# Patient Record
Sex: Female | Born: 1960
Health system: Southern US, Community
[De-identification: ages and names within clinical notes are randomized; demographics above are authoritative.]

## PROBLEM LIST (undated history)

## (undated) DIAGNOSIS — D649 Anemia, unspecified: Secondary | ICD-10-CM

## (undated) DIAGNOSIS — T7840XA Allergy, unspecified, initial encounter: Secondary | ICD-10-CM

## (undated) DIAGNOSIS — M5136 Other intervertebral disc degeneration, lumbar region: Secondary | ICD-10-CM

## (undated) DIAGNOSIS — R21 Rash and other nonspecific skin eruption: Secondary | ICD-10-CM

## (undated) DIAGNOSIS — F419 Anxiety disorder, unspecified: Secondary | ICD-10-CM

## (undated) DIAGNOSIS — M810 Age-related osteoporosis without current pathological fracture: Secondary | ICD-10-CM

## (undated) DIAGNOSIS — S82209A Unspecified fracture of shaft of unspecified tibia, initial encounter for closed fracture: Secondary | ICD-10-CM

## (undated) DIAGNOSIS — K219 Gastro-esophageal reflux disease without esophagitis: Secondary | ICD-10-CM

## (undated) DIAGNOSIS — M51369 Other intervertebral disc degeneration, lumbar region without mention of lumbar back pain or lower extremity pain: Secondary | ICD-10-CM

## (undated) DIAGNOSIS — S82409A Unspecified fracture of shaft of unspecified fibula, initial encounter for closed fracture: Secondary | ICD-10-CM

## (undated) HISTORY — DX: Allergy, unspecified, initial encounter: T78.40XA

## (undated) HISTORY — PX: DILATION AND CURETTAGE OF UTERUS: SHX78

## (undated) HISTORY — DX: Anemia, unspecified: D64.9

## (undated) HISTORY — DX: Age-related osteoporosis without current pathological fracture: M81.0

## (undated) HISTORY — PX: FRACTURE SURGERY: SHX138

---

## 1994-04-11 HISTORY — PX: DILATION AND CURETTAGE OF UTERUS: SHX78

## 1997-09-19 ENCOUNTER — Other Ambulatory Visit: Admission: RE | Admit: 1997-09-19 | Discharge: 1997-09-19 | Payer: Self-pay | Admitting: *Deleted

## 1999-08-26 ENCOUNTER — Encounter: Payer: Self-pay | Admitting: Specialist

## 1999-08-26 ENCOUNTER — Encounter: Admission: RE | Admit: 1999-08-26 | Discharge: 1999-08-26 | Payer: Self-pay | Admitting: Specialist

## 1999-09-10 ENCOUNTER — Other Ambulatory Visit: Admission: RE | Admit: 1999-09-10 | Discharge: 1999-09-10 | Payer: Self-pay | Admitting: *Deleted

## 2004-05-10 ENCOUNTER — Encounter: Admission: RE | Admit: 2004-05-10 | Discharge: 2004-05-10 | Payer: Self-pay | Admitting: Specialist

## 2004-05-25 ENCOUNTER — Encounter: Admission: RE | Admit: 2004-05-25 | Discharge: 2004-05-25 | Payer: Self-pay | Admitting: Obstetrics and Gynecology

## 2004-10-01 ENCOUNTER — Ambulatory Visit: Payer: Self-pay | Admitting: Internal Medicine

## 2004-10-14 ENCOUNTER — Ambulatory Visit: Payer: Self-pay | Admitting: Internal Medicine

## 2005-07-26 ENCOUNTER — Ambulatory Visit: Payer: Self-pay | Admitting: Internal Medicine

## 2006-11-20 ENCOUNTER — Encounter: Admission: RE | Admit: 2006-11-20 | Discharge: 2006-11-20 | Payer: Self-pay | Admitting: Emergency Medicine

## 2007-07-13 ENCOUNTER — Emergency Department (HOSPITAL_COMMUNITY): Admission: EM | Admit: 2007-07-13 | Discharge: 2007-07-13 | Payer: Self-pay | Admitting: Emergency Medicine

## 2007-11-21 ENCOUNTER — Encounter: Admission: RE | Admit: 2007-11-21 | Discharge: 2007-11-21 | Payer: Self-pay | Admitting: Emergency Medicine

## 2009-01-23 ENCOUNTER — Encounter: Admission: RE | Admit: 2009-01-23 | Discharge: 2009-01-23 | Payer: Self-pay | Admitting: Family Medicine

## 2009-01-23 ENCOUNTER — Encounter: Payer: Self-pay | Admitting: Internal Medicine

## 2011-02-19 ENCOUNTER — Emergency Department (HOSPITAL_COMMUNITY)
Admission: EM | Admit: 2011-02-19 | Discharge: 2011-02-19 | Disposition: A | Discharge status: Home / Self Care | Payer: Self-pay | Attending: Emergency Medicine | Admitting: Emergency Medicine

## 2011-02-19 ENCOUNTER — Emergency Department (HOSPITAL_COMMUNITY): Payer: Self-pay

## 2011-02-19 ENCOUNTER — Encounter: Payer: Self-pay | Admitting: *Deleted

## 2011-02-19 DIAGNOSIS — S82899A Other fracture of unspecified lower leg, initial encounter for closed fracture: Secondary | ICD-10-CM | POA: Insufficient documentation

## 2011-02-19 DIAGNOSIS — M25579 Pain in unspecified ankle and joints of unspecified foot: Secondary | ICD-10-CM | POA: Insufficient documentation

## 2011-02-19 DIAGNOSIS — R209 Unspecified disturbances of skin sensation: Secondary | ICD-10-CM | POA: Insufficient documentation

## 2011-02-19 DIAGNOSIS — W11XXXA Fall on and from ladder, initial encounter: Secondary | ICD-10-CM | POA: Insufficient documentation

## 2011-02-19 DIAGNOSIS — M549 Dorsalgia, unspecified: Secondary | ICD-10-CM | POA: Insufficient documentation

## 2011-02-19 LAB — BASIC METABOLIC PANEL
BUN: 12 mg/dL (ref 6–23)
Creatinine, Ser: 0.68 mg/dL (ref 0.50–1.10)
GFR calc Af Amer: >90 mL/min (ref >90)
GFR calc non Af Amer: >90 mL/min (ref >90)
Glucose, Bld: 120 mg/dL — ABNORMAL HIGH (ref 70–99)

## 2011-02-19 LAB — CBC
HCT: 37.7 % (ref 36.0–46.0)
Hemoglobin: 13 g/dL (ref 12.0–15.0)
MCH: 30.2 pg (ref 26.0–34.0)
MCHC: 34.5 g/dL (ref 30.0–36.0)
MCV: 87.5 fL (ref 78.0–100.0)

## 2011-02-19 LAB — DIFFERENTIAL
Basophils Relative: 0 % (ref 0–1)
Eosinophils Absolute: 0.1 10*3/uL (ref 0.0–0.7)
Monocytes Absolute: 0.6 10*3/uL (ref 0.1–1.0)
Monocytes Relative: 6 % (ref 3–12)

## 2011-02-19 MED ORDER — OXYCODONE-ACETAMINOPHEN 5-325 MG PO TABS: 1.0000 | ORAL_TABLET | ORAL | Status: AC | PRN

## 2011-02-19 MED ORDER — MORPHINE SULFATE 4 MG/ML IJ SOLN
4.0000 mg | Freq: Once | INTRAMUSCULAR | Status: AC
  Administered 2011-02-19: 4 mg via INTRAVENOUS
  Filled 2011-02-19: qty 1

## 2011-02-19 MED ORDER — HYDROMORPHONE HCL PF 1 MG/ML IJ SOLN
1.0000 mg | Freq: Once | INTRAMUSCULAR | Status: AC
  Administered 2011-02-19: 1 mg via INTRAVENOUS
  Filled 2011-02-19: qty 1

## 2011-02-19 MED ORDER — DIPHENHYDRAMINE HCL 50 MG/ML IJ SOLN
25.0000 mg | Freq: Once | INTRAMUSCULAR | Status: AC
  Administered 2011-02-19: 25 mg via INTRAVENOUS
  Filled 2011-02-19: qty 1

## 2011-02-19 NOTE — ED Provider Notes (Signed)
History     CSN: [id number] Arrival date & time: 02/19/2011 12:50 PM   First MD Initiated Contact with Patient 02/19/11 1314      Chief Complaint  Patient presents with  . Ankle Pain    (Consider location/radiation/quality/duration/timing/severity/associated sxs/prior treatment) Patient is a 50 y.o. female presenting with ankle pain. The history is provided by the patient (Patient states that she's off a ladder yesterday and hurt her right ankle just complains of mild back pain she has not walked since that).  Ankle Pain  The incident occurred yesterday. The incident occurred at home. The injury mechanism was a fall. The pain is present in the right ankle. The quality of the pain is described as sharp. The pain is at a severity of 5/10. The pain is moderate. The pain has been intermittent since onset. Associated symptoms include numbness and inability to bear weight. She has tried nothing for the symptoms.    Past Medical History  Diagnosis Date  . Degeneration, intervertebral disc     History reviewed. No pertinent past surgical history.  No family history on file.  History  Substance Use Topics  . Smoking status: Current Everyday Smoker  . Smokeless tobacco: Not on file  . Alcohol Use: Yes     occ    OB History    Grav Para Term Preterm Abortions TAB SAB Ect Mult Living                  Review of Systems  Constitutional: Negative for fatigue.  HENT: Negative for congestion, sinus pressure and ear discharge.   Eyes: Negative for discharge.  Respiratory: Negative for cough.   Cardiovascular: Negative for chest pain.  Gastrointestinal: Negative for abdominal pain and diarrhea.  Genitourinary: Negative for frequency and hematuria.  Musculoskeletal: Negative for back pain.       Severe ankle pain  Skin: Negative for rash.  Neurological: Positive for numbness. Negative for seizures and headaches.  Hematological: Negative.   Psychiatric/Behavioral: Negative for  hallucinations.    Allergies  Erythromycin  Home Medications   Current Outpatient Rx  Name Route Sig Dispense Refill  . ALBUTEROL SULFATE HFA 108 (90 BASE) MCG/ACT IN AERS Inhalation Inhale 2 puffs into the lungs every 6 (six) hours as needed. For shortness of breath     . AMOXICILLIN 500 MG PO CAPS Oral Take 500 mg by mouth 2 (two) times daily.      Marland Kitchen HYDROCODONE-ACETAMINOPHEN 5-500 MG PO TABS Oral Take 1 tablet by mouth every 6 (six) hours as needed. For pain      . OXYCODONE-ACETAMINOPHEN 5-325 MG PO TABS Oral Take 1 tablet by mouth every 4 (four) hours as needed for pain. 30 tablet 0    BP 99/67  Pulse 65  Temp(Src) 97.9 F (36.6 C) (Oral)  Resp 20  SpO2 94%  Physical Exam  Constitutional: She is oriented to person, place, and time. She appears well-developed.  HENT:  Head: Normocephalic and atraumatic.  Eyes: Conjunctivae and EOM are normal. No scleral icterus.  Neck: Neck supple. No thyromegaly present.  Cardiovascular: Normal rate and regular rhythm.  Exam reveals no gallop and no friction rub.   No murmur heard. Pulmonary/Chest: No stridor. She has no wheezes. She has no rales. She exhibits no tenderness.  Abdominal: She exhibits no distension. There is no tenderness. There is no rebound.  Musculoskeletal:       Patient has tenderness and swelling to right ankle dorsalis pedis pulses 2+ mild deformity  to ankle. Patient also has mild tenderness to lumbar spine full range of motion of that  Lymphadenopathy:    She has no cervical adenopathy.  Neurological: She is oriented to person, place, and time. Coordination normal.  Skin: No rash noted. No erythema.  Psychiatric: She has a normal mood and affect. Her behavior is normal.    ED Course  Procedures (including critical care time)  Labs Reviewed  CBC - Abnormal; Notable for the following:    WBC 10.9 (*)    All other components within normal limits  DIFFERENTIAL - Abnormal; Notable for the following:     Neutrophils Relative 82 (*)    Neutro Abs 8.9 (*)    All other components within normal limits  BASIC METABOLIC PANEL - Abnormal; Notable for the following:    Sodium 134 (*)    Glucose, Bld 120 (*)    All other components within normal limits   Dg Lumbar Spine Complete  02/19/2011  *RADIOLOGY REPORT*  Clinical Data: Fall  LUMBAR SPINE - COMPLETE 4+ VIEW  Comparison: MRI 05/10/2004  Findings: Normal alignment lumbar vertebral bodies. There is an endplate compression fracture of the anterior superior endplate of the L2 vertebral body.  There is approximately 10% loss vertebral body height at this level. No retropulsion.  There is degenerative changes at L3-L4 and L4-L5 with disc space narrowing and endplate osteophytosis.  No evidence retropulsion.  No subluxation.  IMPRESSION: Mild compression fracture of the superior endplate of the L2 vertebral body.  Original Report Authenticated By: Genevive Bi, M.D.   Dg Tibia/fibula Right  02/19/2011  *RADIOLOGY REPORT*  Clinical Data: Fall from ladder  RIGHT TIBIA AND FIBULA - 2 VIEW  Comparison: None.  Findings: There is an oblique comminuted fracture through the distal fibular diaphysis.  No evidence of proximal fracture within the tibia or fibula.  IMPRESSION: Comminuted fracture of the distal fibula diaphysis.  Original Report Authenticated By: Genevive Bi, M.D.   Dg Ankle Complete Right  02/19/2011  *RADIOLOGY REPORT*  Clinical Data: Fall from ladder  RIGHT ANKLE - COMPLETE 3+ VIEW  Comparison: None.  Findings: There is a comminuted fracture of the distal tibia which enters the articular surface.  There is a fracture of the distal fibular diaphysis which is also comminuted.  Posterior malleolus is fractured.  The ankle mortise is grossly intact. Question injury to the lateral aspect the talus.  IMPRESSION:  1.  Comminuted intra-articular fracture of the distal tibia. 2.   Fracture of the fibular diaphysis. 3.  Question injury to the lateral  aspect of the talar dome  Original Report Authenticated By: Genevive Bi, M.D.     1. Ankle fracture    I spoke with Dr. supple and he stated that the patient can be put in a stirrup splint and can followup on Monday at their office   MDM  Ankle fx        Benny Lennert, MD 02/19/11 [phone]

## 2011-02-19 NOTE — ED Notes (Signed)
Pt is pain free.  Paper scrubs given

## 2011-02-19 NOTE — ED Notes (Signed)
Severe pain with splint application.  Verbal order from Dr. Estell Harpin for dilaudid 1mg  IVP.  Given, pt feels better after

## 2011-02-19 NOTE — Progress Notes (Signed)
Orthopedic Tech Progress Note Patient Details:  Jasmin Bonilla April 04, 1961 [id number]  Other Ortho Devices Type of Ortho Device: Crutches Ortho Device Interventions: Application  Type of Splint: Stirrup Splint Location: right leg Splint Interventions: Application    Nikki Dom 02/19/2011, 4:25 PM

## 2011-02-19 NOTE — ED Notes (Signed)
Pt was climbing a ladder about 7-65ft and fell off yesterday.  Pt has swelling and pain to right ankle and lower back pain.  No blood thinners

## 2011-02-19 NOTE — ED Notes (Signed)
Pt is in no distress, swelling of ankle, ice pack in place.  Ortho called for splint and crutches

## 2011-02-19 NOTE — ED Notes (Signed)
Pt up into bed, ice applied to right ankle, family at bedside

## 2011-02-28 ENCOUNTER — Ambulatory Visit
Admission: RE | Admit: 2011-02-28 | Discharge: 2011-02-28 | Disposition: A | Discharge status: Home / Self Care | Payer: BC Managed Care – PPO | Source: Ambulatory Visit | Attending: Orthopedic Surgery | Admitting: Orthopedic Surgery

## 2011-02-28 ENCOUNTER — Other Ambulatory Visit: Payer: Self-pay | Admitting: Orthopedic Surgery

## 2011-02-28 DIAGNOSIS — S82209A Unspecified fracture of shaft of unspecified tibia, initial encounter for closed fracture: Secondary | ICD-10-CM

## 2011-03-11 ENCOUNTER — Encounter (HOSPITAL_BASED_OUTPATIENT_CLINIC_OR_DEPARTMENT_OTHER): Payer: Self-pay | Admitting: *Deleted

## 2011-03-11 NOTE — Pre-Procedure Instructions (Signed)
To come for EKG  Current non-prod. cough  Cat scratch right arm  States periods have become very irregular, no period in 2 mos.

## 2011-03-14 ENCOUNTER — Encounter (HOSPITAL_BASED_OUTPATIENT_CLINIC_OR_DEPARTMENT_OTHER)
Admission: RE | Admit: 2011-03-14 | Discharge: 2011-03-14 | Disposition: A | Discharge status: Home / Self Care | Payer: BC Managed Care – PPO | Source: Ambulatory Visit | Attending: Orthopedic Surgery | Admitting: Orthopedic Surgery

## 2011-03-15 ENCOUNTER — Encounter (HOSPITAL_BASED_OUTPATIENT_CLINIC_OR_DEPARTMENT_OTHER): Payer: Self-pay | Admitting: *Deleted

## 2011-03-15 ENCOUNTER — Ambulatory Visit (HOSPITAL_COMMUNITY): Payer: BC Managed Care – PPO

## 2011-03-15 ENCOUNTER — Ambulatory Visit (HOSPITAL_BASED_OUTPATIENT_CLINIC_OR_DEPARTMENT_OTHER): Payer: BC Managed Care – PPO | Admitting: *Deleted

## 2011-03-15 ENCOUNTER — Ambulatory Visit (HOSPITAL_BASED_OUTPATIENT_CLINIC_OR_DEPARTMENT_OTHER)
Admission: RE | Admit: 2011-03-15 | Discharge: 2011-03-16 | Disposition: A | Discharge status: Home / Self Care | Payer: BC Managed Care – PPO | Source: Ambulatory Visit | Attending: Orthopedic Surgery | Admitting: Orthopedic Surgery

## 2011-03-15 ENCOUNTER — Encounter (HOSPITAL_BASED_OUTPATIENT_CLINIC_OR_DEPARTMENT_OTHER)
Admission: RE | Disposition: A | Discharge status: Home / Self Care | Payer: Self-pay | Source: Ambulatory Visit | Attending: Orthopedic Surgery

## 2011-03-15 DIAGNOSIS — X58XXXA Exposure to other specified factors, initial encounter: Secondary | ICD-10-CM | POA: Insufficient documentation

## 2011-03-15 DIAGNOSIS — S82209A Unspecified fracture of shaft of unspecified tibia, initial encounter for closed fracture: Secondary | ICD-10-CM | POA: Insufficient documentation

## 2011-03-15 DIAGNOSIS — Z01812 Encounter for preprocedural laboratory examination: Secondary | ICD-10-CM | POA: Insufficient documentation

## 2011-03-15 DIAGNOSIS — S82409A Unspecified fracture of shaft of unspecified fibula, initial encounter for closed fracture: Secondary | ICD-10-CM | POA: Insufficient documentation

## 2011-03-15 DIAGNOSIS — S82309A Unspecified fracture of lower end of unspecified tibia, initial encounter for closed fracture: Secondary | ICD-10-CM

## 2011-03-15 DIAGNOSIS — K219 Gastro-esophageal reflux disease without esophagitis: Secondary | ICD-10-CM | POA: Insufficient documentation

## 2011-03-15 HISTORY — DX: Gastro-esophageal reflux disease without esophagitis: K21.9

## 2011-03-15 HISTORY — DX: Anxiety disorder, unspecified: F41.9

## 2011-03-15 HISTORY — DX: Other intervertebral disc degeneration, lumbar region without mention of lumbar back pain or lower extremity pain: M51.369

## 2011-03-15 HISTORY — DX: Other intervertebral disc degeneration, lumbar region: M51.36

## 2011-03-15 HISTORY — DX: Unspecified fracture of shaft of unspecified tibia, initial encounter for closed fracture: S82.409A

## 2011-03-15 HISTORY — DX: Unspecified fracture of shaft of unspecified tibia, initial encounter for closed fracture: S82.209A

## 2011-03-15 HISTORY — PX: ORIF FIBULA FRACTURE: SHX5114

## 2011-03-15 HISTORY — DX: Rash and other nonspecific skin eruption: R21

## 2011-03-15 SURGERY — OPEN REDUCTION INTERNAL FIXATION (ORIF) FIBULA FRACTURE
Anesthesia: General | Laterality: Right

## 2011-03-15 MED ORDER — ONDANSETRON HCL 4 MG PO TABS: 4.0000 mg | ORAL_TABLET | Freq: Four times a day (QID) | ORAL | Status: DC | PRN

## 2011-03-15 MED ORDER — EPHEDRINE SULFATE 50 MG/ML IJ SOLN
INTRAMUSCULAR | Status: DC | PRN
  Administered 2011-03-15: 5 mg via INTRAVENOUS

## 2011-03-15 MED ORDER — FAMOTIDINE 20 MG PO TABS: 20.0000 mg | ORAL_TABLET | Freq: Two times a day (BID) | ORAL | Status: DC

## 2011-03-15 MED ORDER — FENTANYL CITRATE 0.05 MG/ML IJ SOLN
INTRAMUSCULAR | Status: DC | PRN
  Administered 2011-03-15 (×4): 25 ug via INTRAVENOUS

## 2011-03-15 MED ORDER — DIPHENHYDRAMINE HCL 25 MG PO TABS
25.0000 mg | ORAL_TABLET | ORAL | Status: DC | PRN
  Administered 2011-03-16: 25 mg via ORAL

## 2011-03-15 MED ORDER — CEFAZOLIN SODIUM-DEXTROSE 2-3 GM-% IV SOLR
2.0000 g | INTRAVENOUS | Status: AC
  Administered 2011-03-15: 1 g via INTRAVENOUS

## 2011-03-15 MED ORDER — OXYCODONE HCL 10 MG PO TB12: 10.0000 mg | ORAL_TABLET | Freq: Two times a day (BID) | ORAL | Status: DC

## 2011-03-15 MED ORDER — DEXTROSE-NACL 5-0.45 % IV SOLN: INTRAVENOUS | Status: DC

## 2011-03-15 MED ORDER — ONDANSETRON HCL 4 MG/2ML IJ SOLN
INTRAMUSCULAR | Status: DC | PRN
  Administered 2011-03-15: 4 mg via INTRAVENOUS

## 2011-03-15 MED ORDER — ZOLPIDEM TARTRATE 5 MG PO TABS: 5.0000 mg | ORAL_TABLET | Freq: Every evening | ORAL | Status: DC | PRN

## 2011-03-15 MED ORDER — METHOCARBAMOL 500 MG PO TABS
500.0000 mg | ORAL_TABLET | Freq: Four times a day (QID) | ORAL | Status: DC | PRN
  Administered 2011-03-15 – 2011-03-16 (×4): 500 mg via ORAL

## 2011-03-15 MED ORDER — BUPIVACAINE-EPINEPHRINE PF 0.5-1:200000 % IJ SOLN
INTRAMUSCULAR | Status: DC | PRN
  Administered 2011-03-15: 25 mL

## 2011-03-15 MED ORDER — OXYCODONE HCL 5 MG PO TABS: 5.0000 mg | ORAL_TABLET | ORAL | Status: DC | PRN

## 2011-03-15 MED ORDER — ASPIRIN EC 325 MG PO TBEC: 325.0000 mg | DELAYED_RELEASE_TABLET | Freq: Every day | ORAL | Status: DC

## 2011-03-15 MED ORDER — HYDROMORPHONE HCL PF 1 MG/ML IJ SOLN
0.5000 mg | INTRAMUSCULAR | Status: DC | PRN
  Administered 2011-03-15 (×2): 0.5 mg via INTRAVENOUS
  Administered 2011-03-16 (×2): 1 mg via INTRAVENOUS
  Administered 2011-03-16: 0.5 mg via INTRAVENOUS

## 2011-03-15 MED ORDER — SODIUM CHLORIDE 0.9 % IV SOLN
INTRAVENOUS | Status: DC
  Administered 2011-03-15 – 2011-03-16 (×2): via INTRAVENOUS

## 2011-03-15 MED ORDER — ACETAMINOPHEN 10 MG/ML IV SOLN
1000.0000 mg | Freq: Once | INTRAVENOUS | Status: AC
  Administered 2011-03-15: 1000 mg via INTRAVENOUS

## 2011-03-15 MED ORDER — ONDANSETRON HCL 4 MG/2ML IJ SOLN: 4.0000 mg | Freq: Four times a day (QID) | INTRAMUSCULAR | Status: DC | PRN

## 2011-03-15 MED ORDER — SODIUM CHLORIDE 0.9 % IV SOLN
INTRAVENOUS | Status: DC
  Administered 2011-03-15: 12:00:00 via INTRAVENOUS

## 2011-03-15 MED ORDER — MIDAZOLAM HCL 2 MG/2ML IJ SOLN
1.0000 mg | INTRAMUSCULAR | Status: DC | PRN
  Administered 2011-03-15: 2 mg via INTRAVENOUS

## 2011-03-15 MED ORDER — HYDROMORPHONE HCL PF 1 MG/ML IJ SOLN
0.2500 mg | INTRAMUSCULAR | Status: DC | PRN
  Administered 2011-03-15 (×2): 0.5 mg via INTRAVENOUS

## 2011-03-15 MED ORDER — SODIUM CHLORIDE 0.9 % IV SOLN: INTRAVENOUS | Status: DC

## 2011-03-15 MED ORDER — DEXAMETHASONE SODIUM PHOSPHATE 10 MG/ML IJ SOLN
INTRAMUSCULAR | Status: DC | PRN
  Administered 2011-03-15: 10 mg via INTRAVENOUS

## 2011-03-15 MED ORDER — METHOCARBAMOL 100 MG/ML IJ SOLN: 500.0000 mg | Freq: Four times a day (QID) | INTRAVENOUS | Status: DC | PRN

## 2011-03-15 MED ORDER — FENTANYL CITRATE 0.05 MG/ML IJ SOLN
50.0000 ug | INTRAMUSCULAR | Status: DC | PRN
  Administered 2011-03-15: 100 ug via INTRAVENOUS

## 2011-03-15 MED ORDER — SENNA 8.6 MG PO TABS: 1.0000 | ORAL_TABLET | Freq: Two times a day (BID) | ORAL | Status: DC

## 2011-03-15 MED ORDER — OXYCODONE HCL 10 MG PO TB12
10.0000 mg | ORAL_TABLET | Freq: Two times a day (BID) | ORAL | Status: DC
  Administered 2011-03-15: 10 mg via ORAL

## 2011-03-15 MED ORDER — CHLORHEXIDINE GLUCONATE 4 % EX LIQD: 60.0000 mL | Freq: Once | CUTANEOUS | Status: DC

## 2011-03-15 MED ORDER — LACTATED RINGERS IV SOLN
INTRAVENOUS | Status: DC
  Administered 2011-03-15 (×2): via INTRAVENOUS

## 2011-03-15 MED ORDER — LIDOCAINE HCL (CARDIAC) 20 MG/ML IV SOLN
INTRAVENOUS | Status: DC | PRN
  Administered 2011-03-15: 60 mg via INTRAVENOUS

## 2011-03-15 MED ORDER — DOCUSATE SODIUM 100 MG PO CAPS
100.0000 mg | ORAL_CAPSULE | Freq: Two times a day (BID) | ORAL | Status: DC
  Administered 2011-03-16: 100 mg via ORAL

## 2011-03-15 MED ORDER — PROPOFOL 10 MG/ML IV EMUL
INTRAVENOUS | Status: DC | PRN
  Administered 2011-03-15: 160 mg via INTRAVENOUS
  Administered 2011-03-15: 20 mg via INTRAVENOUS

## 2011-03-15 MED ORDER — OXYCODONE HCL 5 MG PO TABS
5.0000 mg | ORAL_TABLET | ORAL | Status: DC | PRN
  Administered 2011-03-15: 5 mg via ORAL
  Administered 2011-03-16 (×4): 10 mg via ORAL

## 2011-03-15 MED ORDER — SENNOSIDES 8.6 MG PO TABS: 2.0000 | ORAL_TABLET | Freq: Every day | ORAL | Status: DC

## 2011-03-15 MED ORDER — DOCUSATE SODIUM 100 MG PO CAPS: 100.0000 mg | ORAL_CAPSULE | Freq: Two times a day (BID) | ORAL | Status: DC

## 2011-03-15 MED ORDER — PROMETHAZINE HCL 25 MG/ML IJ SOLN: 6.2500 mg | INTRAMUSCULAR | Status: DC | PRN

## 2011-03-15 SURGICAL SUPPLY — 67 items
1.6MM K-WIRE ×6 IMPLANT
2.5MM DRILL BIT ×2 IMPLANT
2.7MM DRILL BIT ×2 IMPLANT
8141-23-008 ×2 IMPLANT
8150-37-014 ×4 IMPLANT
8150-37-016 ×6 IMPLANT
8150-37-018 ×2 IMPLANT
8150-37-028 ×6 IMPLANT
8155-40-045 ×2 IMPLANT
8161-35-024 ×2 IMPLANT
8161-35-030 ×2 IMPLANT
8161-35-038 ×8 IMPLANT
8161-35-044 ×2 IMPLANT
8162-02-006 ×2 IMPLANT
BAG DECANTER FOR FLEXI CONT (MISCELLANEOUS) ×2 IMPLANT
BANDAGE ESMARK 6X9 LF (GAUZE/BANDAGES/DRESSINGS) ×1 IMPLANT
BLADE SURG 15 STRL LF DISP TIS (BLADE) ×3 IMPLANT
BLADE SURG 15 STRL SS (BLADE) ×3
BNDG COHESIVE 4X5 TAN STRL (GAUZE/BANDAGES/DRESSINGS) ×2 IMPLANT
BNDG COHESIVE 6X5 TAN STRL LF (GAUZE/BANDAGES/DRESSINGS) ×2 IMPLANT
BNDG ESMARK 6X9 LF (GAUZE/BANDAGES/DRESSINGS) ×2
CHLORAPREP W/TINT 26ML (MISCELLANEOUS) ×2 IMPLANT
COVER TABLE BACK 60X90 (DRAPES) ×2 IMPLANT
CUFF TOURNIQUET SINGLE 34IN LL (TOURNIQUET CUFF) ×2 IMPLANT
DECANTER SPIKE VIAL GLASS SM (MISCELLANEOUS) IMPLANT
DRAPE EXTREMITY T 121X128X90 (DRAPE) ×2 IMPLANT
DRAPE INCISE IOBAN 66X45 STRL (DRAPES) IMPLANT
DRAPE OEC MINIVIEW 54X84 (DRAPES) ×2 IMPLANT
DRAPE U-SHAPE 47X51 STRL (DRAPES) ×2 IMPLANT
DRAPE U-SHAPE 76X120 STRL (DRAPES) ×2 IMPLANT
DRSG EMULSION OIL 3X3 NADH (GAUZE/BANDAGES/DRESSINGS) ×4 IMPLANT
DRSG PAD ABDOMINAL 8X10 ST (GAUZE/BANDAGES/DRESSINGS) ×4 IMPLANT
GLOVE BIO SURGEON STRL SZ 6 (GLOVE) ×2 IMPLANT
GLOVE BIO SURGEON STRL SZ8 (GLOVE) ×2 IMPLANT
GLOVE BIO SURGEON STRL SZ8.5 (GLOVE) ×2 IMPLANT
GLOVE BIOGEL PI IND STRL 8 (GLOVE) ×1 IMPLANT
GLOVE BIOGEL PI INDICATOR 8 (GLOVE) ×1
GOWN PREVENTION PLUS XLARGE (GOWN DISPOSABLE) ×2 IMPLANT
GOWN PREVENTION PLUS XXLARGE (GOWN DISPOSABLE) ×2 IMPLANT
NEEDLE HYPO 22GX1.5 SAFETY (NEEDLE) IMPLANT
PACK BASIN DAY SURGERY FS (CUSTOM PROCEDURE TRAY) ×2 IMPLANT
PAD CAST 4YDX4 CTTN HI CHSV (CAST SUPPLIES) ×1 IMPLANT
PADDING CAST ABS 4INX4YD NS (CAST SUPPLIES)
PADDING CAST ABS COTTON 4X4 ST (CAST SUPPLIES) IMPLANT
PADDING CAST COTTON 4X4 STRL (CAST SUPPLIES) ×1
PADDING CAST COTTON 6X4 STRL (CAST SUPPLIES) ×2 IMPLANT
PENCIL BUTTON HOLSTER BLD 10FT (ELECTRODE) ×2 IMPLANT
SHEET MEDIUM DRAPE 40X70 STRL (DRAPES) ×2 IMPLANT
SPLINT FAST PLASTER 5X30 (CAST SUPPLIES) ×20
SPLINT PLASTER CAST FAST 5X30 (CAST SUPPLIES) ×20 IMPLANT
SPONGE GAUZE 4X4 12PLY (GAUZE/BANDAGES/DRESSINGS) ×4 IMPLANT
SPONGE LAP 18X18 X RAY DECT (DISPOSABLE) ×2 IMPLANT
STAPLER VISISTAT 35W (STAPLE) IMPLANT
STOCKINETTE 6  STRL (DRAPES) ×1
STOCKINETTE 6 STRL (DRAPES) ×1 IMPLANT
STRIP CLOSURE SKIN 1/2X4 (GAUZE/BANDAGES/DRESSINGS) ×2 IMPLANT
SUCTION FRAZIER TIP 10 FR DISP (SUCTIONS) ×2 IMPLANT
SUT MNCRL AB 4-0 PS2 18 (SUTURE) ×4 IMPLANT
SUT PROLENE 3 0 PS 2 (SUTURE) ×4 IMPLANT
SUT VIC AB 0 SH 27 (SUTURE) ×2 IMPLANT
SUT VIC AB 2-0 SH 27 (SUTURE) ×2
SUT VIC AB 2-0 SH 27XBRD (SUTURE) ×1 IMPLANT
SUT VICRYL 4-0 PS2 18IN ABS (SUTURE) IMPLANT
SYR BULB 3OZ (MISCELLANEOUS) ×2 IMPLANT
TUBE CONNECTING 20X1/4 (TUBING) ×2 IMPLANT
UNDERPAD 30X30 INCONTINENT (UNDERPADS AND DIAPERS) ×2 IMPLANT
WATER STERILE IRR 1000ML POUR (IV SOLUTION) IMPLANT

## 2011-03-15 NOTE — Anesthesia Preprocedure Evaluation (Addendum)
Anesthesia Evaluation  Patient identified by MRN, date of birth, ID band Patient awake    Reviewed: Allergy & Precautions, H&P , NPO status , Patient's Chart, lab work & pertinent test results  Airway Mallampati: I TM Distance: >3 FB Neck ROM: Full    Dental   Pulmonary    Pulmonary exam normal       Cardiovascular     Neuro/Psych    GI/Hepatic GERD-  ,  Endo/Other    Renal/GU      Musculoskeletal   Abdominal   Peds  Hematology   Anesthesia Other Findings   Reproductive/Obstetrics                           Anesthesia Physical Anesthesia Plan  ASA: I  Anesthesia Plan: General   Post-op Pain Management:    Induction: Intravenous  Airway Management Planned: LMA  Additional Equipment:   Intra-op Plan:   Post-operative Plan: Extubation in OR  Informed Consent: I have reviewed the patients History and Physical, chart, labs and discussed the procedure including the risks, benefits and alternatives for the proposed anesthesia with the patient or authorized representative who has indicated his/her understanding and acceptance.   Dental Advisory Given  Plan Discussed with: CRNA and Surgeon  Anesthesia Plan Comments:        Anesthesia Quick Evaluation

## 2011-03-15 NOTE — Anesthesia Postprocedure Evaluation (Signed)
  Anesthesia Post-op Note  Patient: Jasmin Bonilla  Procedure(s) Performed:  OPEN REDUCTION INTERNAL FIXATION (ORIF) FIBULA FRACTURE - right fibula and tibia pilon fractures and possible heel cord lengthening  Patient Location: PACU  Anesthesia Type: GA combined with regional for post-op pain  Level of Consciousness: awake, alert  and oriented  Airway and Oxygen Therapy: Patient Spontanous Breathing  Post-op Pain: none  Post-op Assessment: Post-op Vital signs reviewed, Patient's Cardiovascular Status Stable, Respiratory Function Stable, Patent Airway, No signs of Nausea or vomiting and Pain level controlled  Post-op Vital Signs: stable  Complications: No apparent anesthesia complications

## 2011-03-15 NOTE — H&P (Signed)
Jasmin Bonilla is an 50 y.o. female.   Chief Complaint: Right tibial pilon and fibular shaft fractures HPI: 50 y/o female now almost 4 weeks out from right tibial pilon and fibular shaft fractures.  Pt c/o dull aching pain in ankle.  No smoking since injury.  Past Medical History  Diagnosis Date  . GERD (gastroesophageal reflux disease)     occ. heartburn  . Anxiety     hx. anxiety attacks  . Tibia/fibula fracture     right tibial pilon and fib. fx.  . Degenerative disc disease, lumbar   . Rash     on back - states may be from heating pad use    Past Surgical History  Procedure Date  . Dilation and curettage of uterus 16 yrs. ago    History reviewed. No pertinent family history. Social History:  reports that she has quit smoking. She has never used smokeless tobacco. She reports that she drinks alcohol. She reports that she does not use illicit drugs.  Allergies:  Allergies  Allergen Reactions  . Erythromycin Other (See Comments)    GI upset    Medications Prior to Admission  Medication Dose Route Frequency Provider Last Rate Last Dose  . acetaminophen (OFIRMEV) IVPB 1,000 mg  1,000 mg Intravenous Once Bedelia Person, MD   1,000 mg at 03/15/11 0705  . fentaNYL (SUBLIMAZE) injection 50-100 mcg  50-100 mcg Intravenous PRN Bedelia Person, MD      . lactated ringers infusion   Intravenous Continuous Remonia Richter, MD 20 mL/hr at 03/15/11 [id number]    . midazolam (VERSED) injection 1-2 mg  1-2 mg Intravenous PRN Bedelia Person, MD       Medications Prior to Admission  Medication Sig Dispense Refill  . diphenhydrAMINE (BENADRYL) 25 MG tablet Take 25 mg by mouth as needed.        Marland Kitchen ibuprofen (ADVIL,MOTRIN) 200 MG tablet Take 200 mg by mouth as needed.        Marland Kitchen oxyCODONE-acetaminophen (PERCOCET) 5-325 MG per tablet Take 1 tablet by mouth as needed.        . ranitidine (ZANTAC) 150 MG tablet Take 150 mg by mouth as needed.          Results for orders placed during the hospital encounter of  03/15/11 (from the past 48 hour(s))  POCT HEMOGLOBIN-HEMACUE     Status: Abnormal   Collection Time   03/15/11  7:08 AM      Component Value Range Comment   Hemoglobin 11.1 (*) 12.0 - 15.0 (g/dL)     ROS  NO recent f/c/n/v/wt loss.  Blood pressure 102/69, pulse 76, temperature 97.5 F (36.4 C), temperature source Oral, resp. rate 16, height 5' 3.75" (1.619 m), weight 52.164 kg (115 lb), SpO2 97.00%. Physical Exam WN WD woman in nad.  A and Ox 4.  Mood and affect normal.  EOMI.  Respirations unlabored.  R LE splinted.  Brisk cap refill at toes.  PF / DF of toes 5/5.  Feels LT at toes.  Assessment/Plan Comminuted displaced tibial pilon and fibular shaft fractures.  To OR today for ORIF.  Pt understands the risks and benefits of the alternative treatment options.  She elects to proceed with surgery.  She specifically understands risks of bleeding, infection, nerve damage, need for additional surgery, blood clots, amputation and death.  All questions encouraged and answered.  [name] 03/15/2011, 7:09 AM

## 2011-03-15 NOTE — Anesthesia Procedure Notes (Addendum)
Anesthesia Regional Block:  Popliteal block  Pre-Anesthetic Checklist: ,, timeout performed, Correct Patient, Correct Site, Correct Laterality, Correct Procedure, Correct Position, site marked, Risks and benefits discussed,  Surgical consent,  Pre-op evaluation,  At surgeon's request and post-op pain management  Laterality: Right  Prep: chloraprep       Needles:  Injection technique: Single-shot  Needle Type: Stimulator Needle - 80     Needle Length: 8cm  Needle Gauge: 22 and 22 G    Additional Needles:  Procedures: nerve stimulator and other Popliteal block  Nerve Stimulator or Paresthesia:  Response: 0.5 mA,   Additional Responses:   Narrative:  Start time: 03/15/2011 7:08 AM End time: 03/15/2011 7:22 AM  Performed by: Personally   Additional Notes: Pop Iv sed Good stim down to .5ma No compl Kasik    Procedure Name: LMA Insertion Date/Time: 03/15/2011 7:35 AM Performed by: Suann Larry WOLFE Pre-anesthesia Checklist: Patient identified, Emergency Drugs available, Suction available, Patient being monitored and Timeout performed Patient Re-evaluated:Patient Re-evaluated prior to inductionOxygen Delivery Method: Circle System Utilized Preoxygenation: Pre-oxygenation with 100% oxygen Intubation Type: IV induction Ventilation: Mask ventilation without difficulty LMA: LMA inserted LMA Size: 3.0 Number of attempts: 1 Airway Equipment and Method: bite block (gauze bite block inserted easily) Placement Confirmation: positive ETCO2 and breath sounds checked- equal and bilateral Tube secured with: Tape Dental Injury: Teeth and Oropharynx as per pre-operative assessment

## 2011-03-15 NOTE — Brief Op Note (Signed)
03/15/2011  9:43 AM  PATIENT:  Jasmin Bonilla  50 y.o. female  PRE-OPERATIVE DIAGNOSIS:  right tibial pilon and fibular fracture  POST-OPERATIVE DIAGNOSIS:  right tibial pilon and fibular fracture  Procedure(s): 1.  ORIF right tibial pilon and fibular shaft fractures 2.  Fluoro  SURGEON:  [name], MD  ASSISTANT: n/a  ANESTHESIA:   General, regional  EBL: minimal  TOURNIQUET:  1:55 @250  mm hg  COMPLICATIONS:  None apparent  DISPOSITION:  Extubated, awake and stable to recovery.  DICTATION ID:  [id number]

## 2011-03-15 NOTE — Transfer of Care (Signed)
Immediate Anesthesia Transfer of Care Note  Patient: Jasmin Bonilla  Procedure(s) Performed:  OPEN REDUCTION INTERNAL FIXATION (ORIF) FIBULA FRACTURE - right fibula and tibia pilon fractures and possible heel cord lengthening  Patient Location: PACU  Anesthesia Type: General  Level of Consciousness: awake, oriented and patient cooperative  Airway & Oxygen Therapy: Patient Spontanous Breathing and Patient connected to face mask oxygen  Post-op Assessment: Report given to PACU RN, Post -op Vital signs reviewed and stable and Patient moving all extremities  Post vital signs: Reviewed and stable  Complications: No apparent anesthesia complications

## 2011-03-15 NOTE — Op Note (Signed)
NAME:  Jasmin Bonilla, Jasmin Bonilla NO.:  [id number]  MEDICAL RECORD NO.:  [id number]  LOCATION:  STRE7                        FACILITY:  [facility]  PHYSICIAN:  [name], MD        DATE OF BIRTH:  02-Sep-1960  DATE OF PROCEDURE:  03/15/2011 DATE OF DISCHARGE:  02/19/2011                              OPERATIVE REPORT   PREOPERATIVE DIAGNOSIS:  Right tibial pilon and fibular fractures.  POSTOPERATIVE DIAGNOSIS:  Right tibial pilon and fibular fractures.  PROCEDURE: 1. Open reduction and internal fixation of right tibial pilon and     fibular shaft fractures. 2. Intraoperative interpretation of fluoroscopic images.  SURGEON:  [name], MD  ANESTHESIA:  General, regional.  IV FLUIDS:  See anesthesia record.  ESTIMATED BLOOD LOSS:  Minimal.  TOURNIQUET TIME:  1 hour and 55 minutes at 250 mmHg.  COMPLICATIONS:  None apparent.  DISPOSITION:  Extubated, awake, and stable to recovery.  INDICATIONS FOR PROCEDURE:  The patient is a 50 year old female who sustained a right tibial pilon and fibular shaft fracture approximately 4 weeks ago.  She presents now for operative treatment of this injury. She understands the risks and benefits, the alternative treatment options and would like to proceed with surgical treatment. Specifically, she understands the risks of bleeding, infection, nerve damage, blood clots, need for additional surgery, amputation, and death.  PROCEDURE IN DETAIL:  After preoperative consent was obtained and the correct operative site was identified, the patient was brought to the operating room and placed supine on the operating table.  General anesthesia was induced.  Preoperative antibiotics were administered. Surgical time-out was taken.  The right lower extremity was prepped and draped in standard sterile fashion with tourniquet around the thigh. Longitudinal incisions were marked over the anterior ankle and over the lateral aspect of the leg adjacent  to the fibular fracture.  The extremity was exsanguinated and tourniquet was inflated to 250 mmHg.  A lateral incision was made, and sharp dissection was carried down through the skin, and blunt dissection was carried down to the lateral compartment.  The superficial peroneal nerve was identified and was protected throughout the course of the case.  The lateral compartment was opened.  The fibula was identified at the fracture site.  Fracture was reduced and clamped to an 8-hole plate.  There was significant comminution noted over a 2-hole segment of the plate centrally.  The plate was secured distally with 3 bicortical 3.5-mm fully-threaded screws.  The fracture was pulled out to length and clamped with a lobster claw.  Proximally, the plate was fixed with 3 bicortical fully- threaded cortical screws.  The central area of comminution was packed with autograft by morselizing a large, free butterfly fragment that had no soft tissue attachments.  AP and lateral views were obtained showing appropriate reduction of the fibula.  Attention was then turned to the anterior aspect of the ankle.  Anterior incision was made, and sharp dissection was carried down through the skin, taking care to protect crossing branches of the superficial peroneal nerve.  Extensor retinaculum was incised.  The interval between the extensor hallucis longus and tibialis anterior was developed, and the neurovascular  bundle was retracted with the EHL tear.  The fracture was identified  anteriorly.  The 2 large fracture fragments were opened and cleaned of all hematoma.  These were reduced and pinned into position.  A 6-hole narrow anterior tibial plate was selected from the bottom at lower extremity set.  This was secured distally with pins.  AP and lateral views showed appropriate position of the plate.  A 4-mm partially threaded lag screw was then inserted through the plate into the posterior malleolus fragment.   This was noted to have appropriate purchase and closed down the posterior malleolus gap appropriately. Three locking screws were then inserted across the primary row distally, 2 more locking screws were inserted at the distal row.  The lag screw was removed and replaced with a locking screw.  Proximally, the plate was secured with 3 bicortical fully-threaded 3.5-mm screws, and a single 3.5-mm locking screw.  Final AP and lateral and mortise views were obtained showing appropriate reduction of the fracture and appropriate length and position of all hardware.  Both wounds were irrigated copiously.  A 0 Vicryl figure-of-eight sutures were used to close the extensor retinaculum anterior and the lateral compartment laterally. The subcutaneous tissue was closed with inverted simple sutures of 3-0 Monocryl, and 3-0 running Prolene suture was used to close both the skin incisions.  Sterile dressings were applied, followed by a well-padded short-leg splint.  Tourniquet was released at an hour and 55 minutes after application of the dressings.  The patient was then awakened from anesthesia and transported to the recovery room in stable condition.  FOLLOWUP PLAN:  The patient will be observed overnight for pain control. She will be discharged to home nonweightbearing tomorrow morning.  She will follow up with me in 2 weeks for suture removal and conversion to a cast.     [name], MD     JH/MEDQ  D:  03/15/2011  T:  03/15/2011  Job:  [id number]

## 2011-03-16 MED ORDER — OXYCODONE HCL 15 MG PO TB12
30.0000 mg | ORAL_TABLET | Freq: Two times a day (BID) | ORAL | Status: AC
Start: 2011-03-16 — End: 2011-03-16
  Administered 2011-03-16: 30 mg via ORAL

## 2011-03-21 ENCOUNTER — Encounter (HOSPITAL_COMMUNITY): Payer: Self-pay

## 2011-03-21 ENCOUNTER — Emergency Department (HOSPITAL_COMMUNITY)
Admission: EM | Admit: 2011-03-21 | Discharge: 2011-03-21 | Discharge status: Against Medical Advice | Payer: BC Managed Care – PPO | Attending: Emergency Medicine | Admitting: Emergency Medicine

## 2011-03-21 DIAGNOSIS — R0602 Shortness of breath: Secondary | ICD-10-CM | POA: Insufficient documentation

## 2011-03-21 NOTE — ED Notes (Signed)
Patient presents with shortness of breath x 2 days.  Recent surgery on right leg 03/15/11.  Patient called MD's office and MD's office referred patient to ED.

## 2011-03-22 ENCOUNTER — Telehealth: Payer: Self-pay | Admitting: Internal Medicine

## 2011-03-22 NOTE — Telephone Encounter (Signed)
Pt c/o weakness, SOB yesterday with exertion, chest tightness. Went to [facility] ER yesterday and left before being seen due to wait. Dr. Victorino Dike advised pt to contact our office for CXR to r/o PE. 03/15/11 had right ankle surgery with metal plates and 18 screws.   After reviewing pt's chart, has not been seen since 07-26-2005. I offered pt OV with VS today but stressed that if this is a PE it is life threatening and the ER would be best. Pt states she is going to ER for evaluation. Nothing additional needed at this time. Will sign off on phone note.

## 2011-03-24 ENCOUNTER — Encounter (HOSPITAL_BASED_OUTPATIENT_CLINIC_OR_DEPARTMENT_OTHER): Payer: Self-pay | Admitting: Orthopedic Surgery

## 2014-11-03 ENCOUNTER — Other Ambulatory Visit: Payer: Self-pay | Admitting: Family Medicine

## 2014-11-03 ENCOUNTER — Ambulatory Visit
Admission: RE | Admit: 2014-11-03 | Discharge: 2014-11-03 | Disposition: A | Discharge status: Home / Self Care | Payer: No Typology Code available for payment source | Source: Ambulatory Visit | Attending: Family Medicine | Admitting: Family Medicine

## 2014-11-03 DIAGNOSIS — R079 Chest pain, unspecified: Secondary | ICD-10-CM

## 2014-12-04 ENCOUNTER — Other Ambulatory Visit: Payer: Self-pay | Admitting: Family Medicine

## 2014-12-04 ENCOUNTER — Ambulatory Visit
Admission: RE | Admit: 2014-12-04 | Discharge: 2014-12-04 | Disposition: A | Discharge status: Home / Self Care | Payer: No Typology Code available for payment source | Source: Ambulatory Visit | Attending: Family Medicine | Admitting: Family Medicine

## 2014-12-04 DIAGNOSIS — R079 Chest pain, unspecified: Secondary | ICD-10-CM

## 2016-01-26 ENCOUNTER — Ambulatory Visit
Admission: RE | Admit: 2016-01-26 | Discharge: 2016-01-26 | Disposition: A | Discharge status: Home / Self Care | Payer: No Typology Code available for payment source | Source: Ambulatory Visit | Attending: Family Medicine | Admitting: Family Medicine

## 2016-01-26 ENCOUNTER — Other Ambulatory Visit: Payer: Self-pay | Admitting: Family Medicine

## 2016-01-26 DIAGNOSIS — J209 Acute bronchitis, unspecified: Secondary | ICD-10-CM

## 2016-04-28 ENCOUNTER — Institutional Professional Consult (permissible substitution): Payer: Self-pay | Admitting: Internal Medicine

## 2016-05-03 ENCOUNTER — Ambulatory Visit (INDEPENDENT_AMBULATORY_CARE_PROVIDER_SITE_OTHER)
Admission: RE | Admit: 2016-05-03 | Discharge: 2016-05-03 | Disposition: A | Discharge status: Home / Self Care | Payer: BLUE CROSS/BLUE SHIELD | Source: Ambulatory Visit | Attending: Internal Medicine | Admitting: Internal Medicine

## 2016-05-03 ENCOUNTER — Ambulatory Visit (INDEPENDENT_AMBULATORY_CARE_PROVIDER_SITE_OTHER): Payer: BLUE CROSS/BLUE SHIELD | Admitting: Internal Medicine

## 2016-05-03 ENCOUNTER — Encounter: Payer: Self-pay | Admitting: Internal Medicine

## 2016-05-03 ENCOUNTER — Institutional Professional Consult (permissible substitution): Payer: Self-pay | Admitting: Pulmonary Disease

## 2016-05-03 VITALS — BP 112/70 | HR 64 | Ht 63.75 in | Wt 127.4 lb

## 2016-05-03 DIAGNOSIS — R059 Cough, unspecified: Secondary | ICD-10-CM

## 2016-05-03 DIAGNOSIS — R05 Cough: Secondary | ICD-10-CM | POA: Diagnosis not present

## 2016-05-03 DIAGNOSIS — J449 Chronic obstructive pulmonary disease, unspecified: Secondary | ICD-10-CM

## 2016-05-03 MED ORDER — BUDESONIDE-FORMOTEROL FUMARATE 160-4.5 MCG/ACT IN AERO: 2.0000 | INHALATION_SPRAY | Freq: Two times a day (BID) | RESPIRATORY_TRACT | 0 refills | Status: DC

## 2016-05-03 NOTE — Patient Instructions (Addendum)
Please remember to go to the   x-ray department downstairs for your tests - we will call you with the results when they are available.  symbicort 160 Take 2 puffs first thing in am and then another 2 puffs about 12 hours later.   For cough > mucinex dm up to 1200 mg every 12 hours  If cough persists > Try prilosec otc 20mg   Take 30-60 min before first meal of the day and Pepcid ac (famotidine) 20 mg one @  bedtime until cough is completely gone for at least a week without the need for cough suppression  GERD (REFLUX)  is an extremely common cause of respiratory symptoms just like yours , many times with no obvious heartburn at all.    It can be treated with medication, but also with lifestyle changes including elevation of the head of your bed (ideally with 6 inch  bed blocks),  Smoking cessation, avoidance of late meals, excessive alcohol, and avoid fatty foods, chocolate, peppermint, colas, red wine, and acidic juices such as orange juice.  NO MINT OR MENTHOL PRODUCTS SO NO COUGH DROPS  USE SUGARLESS CANDY INSTEAD (Jolley ranchers or Stover's or Life Savers) or even ice chips will also do - the key is to swallow to prevent all throat clearing. NO OIL BASED VITAMINS - use powdered substitutes.       Please schedule a follow up visit in 3 months but call sooner if needed with full pfts

## 2016-05-03 NOTE — Progress Notes (Signed)
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 Pulmonary Consult    Referred by Dr. Jonathon Jordan for eval of Emphysema.  She states that the first time she had a problem was today, when she was unloading a truck at work.   flares occur avg 2-3 x year and rx pred and abx most recent episdose nov 2017 resolved but then not felt as good x 2 weeks but so far no abx this flare  Admits forgets symbicort a lot and just uses more regularly when flaring with cough > sob issues but no excess/ purulent sputum or mucus plugs    No obvious day to day or daytime variability or assoc  cp or chest tightness, subjective wheeze or overt sinus or hb symptoms. No unusual exp hx or h/o childhood pna/ asthma or knowledge of premature birth.  Sleeping ok without nocturnal  or early am exacerbation  of respiratory  c/o's or need for noct saba. Also denies any obvious fluctuation of symptoms with weather or environmental changes or other aggravating or alleviating factors except as outlined above   Current Medications, Allergies, Complete Past Medical History, Past Surgical History, Family History, and Social History were reviewed in [employer] record.  ROS  The following are not active complaints unless bolded sore throat, dysphagia, dental problems, itching, sneezing,  nasal congestion or excess/ purulent secretions, ear ache,   fever, chills, sweats, unintended wt loss, classically pleuritic or exertional cp,  orthopnea pnd or leg swelling, presyncope, palpitations, abdominal pain, anorexia, nausea, vomiting, diarrhea  or change in bowel  or bladder habits, change in stools or urine, dysuria,hematuria,  rash, arthralgias, visual complaints, headache, numbness, weakness or ataxia or problems with walking or coordination,  change in mood/affect or memory.          Review of Systems     Objective:   Physical Exam  amb wf nad  Wt Readings from Last 3 Encounters:  05/03/16 127 lb 6.4 oz (57.8 kg)  03/11/11 115 lb (52.2 kg)    Vital signs reviewed  - Note on arrival 02 sats  97% on RA     HEENT: nl dentition, turbinates, and oropharynx. Nl external ear canals without cough reflex   NECK :  without JVD/Nodes/TM/ nl carotid upstrokes bilaterally   LUNGS: no acc muscle use,  Nl contour chest which is clear to A and P bilaterally without cough on insp or exp maneuvers   CV:  RRR  no s3 or murmur or increase in P2, nad no edema   ABD:  soft and nontender with nl inspiratory excursion in the supine position. No bruits or organomegaly appreciated, bowel sounds nl  MS:  Nl gait/ ext warm without deformities, calf tenderness, cyanosis or clubbing No obvious joint restrictions   SKIN: warm and dry without lesions    NEURO:  alert, approp, nl sensorium with  no motor or cerebellar deficits apparent.     CXR PA and Lateral:   05/03/2016 :    I personally reviewed images and agree with radiology impression as follows:  There is no edema or consolidation. The heart size and pulmonary vascularity are normal. No adenopathy. There is anterior wedging of upper lumbar vertebral bodies, stable.     Assessment:

## 2016-05-04 DIAGNOSIS — R059 Cough, unspecified: Secondary | ICD-10-CM | POA: Insufficient documentation

## 2016-05-04 DIAGNOSIS — R05 Cough: Secondary | ICD-10-CM | POA: Insufficient documentation

## 2016-05-04 NOTE — Assessment & Plan Note (Signed)
Her cough in the office and by hx tends to be dry and somewhat cyclical  Of the three most common causes of chronic cough, only one (GERD)  can actually cause the other two (asthma and post nasal drip syndrome)  and perpetuate the cylce of cough inducing airway trauma, inflammation, heightened sensitivity to reflux which is prompted by the cough itself via a cyclical mechanism.    This may partially respond to steroids and look like asthma and post nasal drainage but never erradicated completely unless the cough and the secondary reflux are eliminated, preferably both at the same time.  While not intuitively obvious, many patients with chronic low grade reflux do not cough until there is a secondary insult that disturbs the protective epithelial barrier and exposes sensitive nerve endings.  This can be viral or direct physical injury such as with an endotracheal tube.   The point is that once this occurs, it is difficult to eliminate using anything but a maximally effective acid suppression regimen at least in the short run, accompanied by an appropriate diet to address non acid GERD.   rec otc max acid suppression/ mucinex dm prn and f/u here if not improving off cigs

## 2016-05-04 NOTE — Progress Notes (Signed)
Spoke with pt and notified of results per Dr. Wert. Pt verbalized understanding and denied any questions. 

## 2016-05-04 NOTE — Assessment & Plan Note (Signed)
Says quit smoking 04/2016  - Spirometry 05/03/2016  FEV1 1.86 (70%)  Ratio 66 with abn effort dep portion of f/v after nothing prior  - 05/03/2016  After extensive coaching HFA effectiveness =    75% > try symbicort 160 2bid   Pt appears to be a group C GOLD with ? Severity of underlying dz but freq exacerbations, at least while smoking     I reviewed the Fletcher curve with the patient that basically indicates  if you quit smoking when your best day FEV1 is still well preserved (as is clearly  the case here)  it is highly unlikely you will progress to severe disease and informed the patient there was  no medication on the market that has proven to alter the curve/ its downward trajectory  or the likelihood of progression of their disease(unlike other chronic medical conditions such as atheroclerosis where we do think we can change the natural hx with risk reducing meds)    Therefore stopping smoking and maintaining abstinence is the most important aspect of care, not choice of inhalers or for that matter, doctors.    For now best option is symbicort 160 2bid and breathe clean air/ f/u with full set of pfts in 3 m  Total time devoted to counseling  > 50 % of a 60 min new pt  office visit:  review case with pt/ discussion of options/alternatives/ personally creating written customized instructions  in presence of pt  then going over those specific  Instructions directly with the pt including how to use all of the meds but in particular covering each new medication in detail and the difference between the maintenance/automatic meds and the prns using an action plan format for the latter.  Please see AVS from this visit for a full list of these instructions which I personally wrote for this pt and  are unique to this visit.

## 2016-08-01 ENCOUNTER — Ambulatory Visit: Payer: BLUE CROSS/BLUE SHIELD | Admitting: Internal Medicine

## 2017-01-23 DIAGNOSIS — M545 Low back pain: Secondary | ICD-10-CM | POA: Diagnosis not present

## 2017-01-23 DIAGNOSIS — Z23 Encounter for immunization: Secondary | ICD-10-CM | POA: Diagnosis not present

## 2017-06-14 ENCOUNTER — Encounter: Payer: Self-pay | Admitting: Neurology

## 2017-06-14 DIAGNOSIS — M62838 Other muscle spasm: Secondary | ICD-10-CM | POA: Diagnosis not present

## 2017-06-30 DIAGNOSIS — J209 Acute bronchitis, unspecified: Secondary | ICD-10-CM | POA: Diagnosis not present

## 2017-09-07 ENCOUNTER — Ambulatory Visit (INDEPENDENT_AMBULATORY_CARE_PROVIDER_SITE_OTHER): Payer: 59 | Admitting: Neurology

## 2017-09-07 ENCOUNTER — Encounter: Payer: Self-pay | Admitting: Neurology

## 2017-09-07 ENCOUNTER — Encounter

## 2017-09-07 ENCOUNTER — Other Ambulatory Visit (INDEPENDENT_AMBULATORY_CARE_PROVIDER_SITE_OTHER): Payer: 59

## 2017-09-07 VITALS — BP 130/80 | HR 84 | Ht 64.0 in | Wt 127.5 lb

## 2017-09-07 DIAGNOSIS — M62838 Other muscle spasm: Secondary | ICD-10-CM | POA: Diagnosis not present

## 2017-09-07 DIAGNOSIS — R252 Cramp and spasm: Secondary | ICD-10-CM | POA: Diagnosis not present

## 2017-09-07 DIAGNOSIS — R292 Abnormal reflex: Secondary | ICD-10-CM | POA: Diagnosis not present

## 2017-09-07 LAB — COMPREHENSIVE METABOLIC PANEL
ALBUMIN: 4.6 g/dL (ref 3.5–5.2)
ALK PHOS: 53 U/L (ref 39–117)
ALT: 13 U/L (ref 0–35)
AST: 16 U/L (ref 0–37)
BUN: 11 mg/dL (ref 6–23)
CO2: 25 mEq/L (ref 19–32)
Calcium: 9.6 mg/dL (ref 8.4–10.5)
Chloride: 105 mEq/L (ref 96–112)
Creatinine, Ser: 0.82 mg/dL (ref 0.40–1.20)
GFR: 76.29 mL/min (ref >60.00)
Glucose, Bld: 100 mg/dL — ABNORMAL HIGH (ref 70–99)
POTASSIUM: 4.1 meq/L (ref 3.5–5.1)
SODIUM: 140 meq/L (ref 135–145)
TOTAL PROTEIN: 7.4 g/dL (ref 6.0–8.3)
Total Bilirubin: 0.5 mg/dL (ref 0.2–1.2)

## 2017-09-07 LAB — CK: Total CK: 102 U/L (ref 7–177)

## 2017-09-07 LAB — TSH: TSH: 0.79 u[IU]/mL (ref 0.35–4.50)

## 2017-09-07 LAB — VITAMIN B12: Vitamin B-12: 191 pg/mL — ABNORMAL LOW (ref 211–911)

## 2017-09-07 LAB — MAGNESIUM: Magnesium: 2.1 mg/dL (ref 1.5–2.5)

## 2017-09-07 MED ORDER — BACLOFEN 10 MG PO TABS: ORAL_TABLET | ORAL | 5 refills | Status: DC

## 2017-09-07 NOTE — Progress Notes (Signed)
[facility] Clinic Note - Initial Visit   Date: 09/07/17  Jasmin Bonilla MRN: [id number] DOB: 17-Jul-1960   Dear Dr. Justin Mend:  Thank you for your kind referral of Jasmin Bonilla for consultation of muscle cramps. Although her history is well known to you, please allow [place] to reiterate it for the purpose of our medical record. The patient was accompanied to the clinic by self.    History of Present Illness: Jasmin Bonilla is a 57 y.o. right-handed Caucasian female with emphysema and tobacco abuse presenting for evaluation of muscle cramps.    For the past several years, she has been having muscle spasm of the legs, back, chest, and hands.  She gets relief with heat.  They occur sporadically a few times per month, lasting 5-15 minutes.  Spasms are worse in the legs and exacerbated by caffeinated beverages and chocolate.  Sometimes, her pain is severe and limits her ability to walk.  She does not have distortion of the hands, toes, or legs when this occurs, and she describes is more as a "severe pain".    Previously tried flexeril and magnesium.  Recently started on ropinirole 0.25mg  and tizanidine 2mg  which she tried for a month and stopped due to no benefit.   She has chronic low back pain.  She has history of degenerative disc disease and has been followed by Dr. Maxie Better at [facility].  She reports having MRI lumbar spine several years ago and was told she needed surgery.  I do not have any imaging records. She broke her right ankle and lower leg in 2012 after falling from a ladder.  She had physical therapy after her injury.    Out-side paper records, electronic medical record, and images have been reviewed where available and summarized as:  XR lumbar spine 02/19/2011:  Mild compression fracture of the superior endplate of the L2 vertebral body.  Past Medical History:  Diagnosis Date  . Anxiety    hx. anxiety attacks  . Degenerative disc disease, lumbar   .  GERD (gastroesophageal reflux disease)    occ. heartburn  . Rash    on back - states may be from heating pad use  . Tibia/fibula fracture    right tibial pilon and fib. fx.    Past Surgical History:  Procedure Laterality Date  . DILATION AND CURETTAGE OF UTERUS  16 yrs. ago  . ORIF FIBULA FRACTURE  03/15/2011   Procedure: OPEN REDUCTION INTERNAL FIXATION (ORIF) FIBULA FRACTURE;  Surgeon: Wylene Simmer, MD;  Location: [facility];  Service: Orthopedics;  Laterality: Right;  right fibula and tibia pilon fractures and possible heel cord lengthening     Medications:  Outpatient Encounter Medications as of 09/07/2017  Medication Sig  . budesonide-formoterol (SYMBICORT) 160-4.5 MCG/ACT inhaler Inhale 2 puffs into the lungs 2 (two) times daily.  . Calcium Carb-Cholecalciferol (CALCIUM + D3 PO) Take by mouth.  . diphenhydrAMINE (BENADRYL) 25 MG tablet Take 25 mg by mouth every 8 (eight) hours as needed. For allergies  . fexofenadine (ALLEGRA) 180 MG tablet Take 180 mg by mouth daily.  . fluticasone (FLONASE) 50 MCG/ACT nasal spray Place 1 spray into both nostrils daily.  Marland Kitchen MAGNESIUM PO Take by mouth.  . ranitidine (ZANTAC) 150 MG tablet Take 150 mg by mouth daily as needed. For heartburn  . traMADol (ULTRAM) 50 MG tablet Take 50 mg by mouth every 6 (six) hours as needed.  . baclofen (LIORESAL) 10 MG tablet Take  half tablet at bedtime x 2 weeks, then increase to 1 tablet at bedtime  . [DISCONTINUED] Budesonide-Formoterol Fumarate (SYMBICORT IN) Inhale 2 puffs into the lungs 2 (two) times daily. Unsure of strength   No facility-administered encounter medications on file as of 09/07/2017.      Allergies:  Allergies  Allergen Reactions  . Erythromycin Other (See Comments)    GI upset    Family History: Family History  Problem Relation Age of Onset  . Alzheimer's disease Mother   . Colon cancer Father   . Cervical cancer Sister   . Diabetes Brother   . Diabetes Maternal  Grandmother   . Heart attack Maternal Grandfather   . Alzheimer's disease Paternal Grandmother   . Stroke Paternal Grandfather     Social History: Social History   Tobacco Use  . Smoking status: Former Smoker    Packs/day: 0.50    Years: 39.00    Pack years: 19.50    Types: Cigarettes    Last attempt to quit: 01/10/2016    Years since quitting: 1.6  . Smokeless tobacco: Never Used  Substance Use Topics  . Alcohol use: Yes    Comment: occ.  . Drug use: No   Social History   Social History Narrative   Lives with ex husband in a 2 story home.  Has one child.  Works as a Scientist, clinical (histocompatibility and immunogenetics).  Education: high school.     Review of Systems:  CONSTITUTIONAL: No fevers, chills, night sweats, or weight loss.   EYES: No visual changes or eye pain ENT: No hearing changes.  No history of nose bleeds.   RESPIRATORY: No cough, wheezing and shortness of breath.   CARDIOVASCULAR: Negative for chest pain, and palpitations  GI: +for abdominal discomfort, blood in stools or black stools.  No recent change in bowel habits.   GU:  No history of incontinence.   MUSCLOSKELETAL: +history of joint pain or swelling.  +myalgias.   SKIN: Negative for lesions, rash, and itching.   HEMATOLOGY/ONCOLOGY: Negative for prolonged bleeding, bruising easily, and swollen nodes.  No history of cancer.   ENDOCRINE: Negative for cold or heat intolerance, polydipsia or goiter.   PSYCH:  No depression or anxiety symptoms.   NEURO: As Above.   Vital Signs:  BP 130/80   Pulse 84   Ht 5\' 4"  (1.626 m)   Wt 127 lb 8 oz (57.8 kg)   SpO2 97%   BMI 21.89 kg/m   General Medical Exam:   General:  Well appearing, comfortable.   Eyes/ENT: see cranial nerve examination.   Neck: No masses appreciated.  Full range of motion without tenderness.  No carotid bruits. Respiratory:  Clear to auscultation, good air entry bilaterally.   Cardiac:  Regular rate and rhythm, no murmur.   Extremities:  No deformities, edema, or  skin discoloration.  Skin:  No rashes or lesions.  Neurological Exam: MENTAL STATUS including orientation to time, place, person, recent and remote memory, attention span and concentration, language, and fund of knowledge is normal.  Speech is not dysarthric.  CRANIAL NERVES: II:  No visual field defects.  Unremarkable fundi.   III-IV-VI: Pupils equal round and reactive to light.  Normal conjugate, extra-ocular eye movements in all directions of gaze.  No nystagmus.  No ptosis.   V:  Normal facial sensation.    VII:  Normal facial symmetry and movements.  VIII:  Normal hearing and vestibular function.   IX-X:  Normal palatal movement.   XI:  Normal shoulder shrug and head rotation.   XII:  Normal tongue strength and range of motion, no deviation or fasciculation.  MOTOR:  No atrophy, fasciculations or abnormal movements.  No pronator drift.  Tone is normal.    Right Upper Extremity:    Left Upper Extremity:    Deltoid  5/5   Deltoid  5/5   Biceps  5/5   Biceps  5/5   Triceps  5/5   Triceps  5/5   Wrist extensors  5/5   Wrist extensors  5/5   Wrist flexors  5/5   Wrist flexors  5/5   Finger extensors  5/5   Finger extensors  5/5   Finger flexors  5/5   Finger flexors  5/5   Dorsal interossei  5/5   Dorsal interossei  5/5   Abductor pollicis  5/5   Abductor pollicis  5/5   Tone (Ashworth scale)  0  Tone (Ashworth scale)  0   Right Lower Extremity:    Left Lower Extremity:    Hip flexors  5/5   Hip flexors  5/5   Hip extensors  5/5   Hip extensors  5/5   Knee flexors  5/5   Knee flexors  5/5   Knee extensors  5/5   Knee extensors  5/5   Dorsiflexors  5/5   Dorsiflexors  5/5   Plantarflexors  5/5   Plantarflexors  5/5   Toe extensors  5/5   Toe extensors  5/5   Toe flexors  5/5   Toe flexors  5/5   Tone (Ashworth scale)  0  Tone (Ashworth scale)  0   MSRs:  Right                                                                 Left brachioradialis 2+  brachioradialis 2+  biceps  2+  biceps 2+  triceps 2+  triceps 2+  patellar 3+  patellar 3+  ankle jerk 2+  ankle jerk 2+  Hoffman no  Hoffman no  plantar response down  plantar response down   SENSORY:  Normal and symmetric perception of light touch, pinprick, vibration, and proprioception.  Romberg's sign absent.   COORDINATION/GAIT: Normal finger-to- nose-finger and heel-to-shin.  Intact rapid alternating movements bilaterally. Gait narrow based and stable. Tandem and stressed gait intact.    IMPRESSION: Generalized muscle cramps/spasm.  Exam shows normal muscle tone, strength, but she has brisk patella reflexes which may indicate lumbosacral canal stenosis, which can manifest with muscle cramps; however, this would not explain upper extremity cramps. Therefore, I will check labs for metabolic derangements, vitamin deficiency, and screen for muscle disease.  She does not take medications which would cause cramps.  Additionally, she will have NCS/EMG of the right side.  I will request records from Dr. Bernadette Hoit office, specifically her most recent imaging.  Repeat imaging may be indicated based on the above results.    PLAN: Check PTH, TSH, CMP, Mg, CK, vitamin B12 for fatigue and cramps NCS/EMG of the right arm and leg Request MRI lumbar spine report from [place] Start baclofen 5mg  at bedtime x 2 weeks then increase to 10mg  at bedtime D/C ropinirole and tizanidine  Further recommendations pending results  Thank you for  allowing me to participate in patient's care.  If I can answer any additional questions, I would be pleased to do so.    Sincerely,    Jasmin Muldoon K. Posey Pronto, DO

## 2017-09-07 NOTE — Patient Instructions (Signed)
NCS/EMG of the right arm and leg  We will request your last MRI lumbar   Check labs  Drink 2-3 glasses of tonic water  Start baclofen 10mg  - Take half tablet at bedtime x 2 weeks, then increase to 1 tablet at bedtime

## 2017-09-08 LAB — PARATHYROID HORMONE, INTACT (NO CA): PTH: 45 pg/mL (ref 14–64)

## 2017-09-12 ENCOUNTER — Ambulatory Visit (INDEPENDENT_AMBULATORY_CARE_PROVIDER_SITE_OTHER): Payer: 59 | Admitting: *Deleted

## 2017-09-12 ENCOUNTER — Telehealth: Payer: Self-pay | Admitting: *Deleted

## 2017-09-12 DIAGNOSIS — E538 Deficiency of other specified B group vitamins: Secondary | ICD-10-CM | POA: Diagnosis not present

## 2017-09-12 MED ORDER — CYANOCOBALAMIN 1000 MCG/ML IJ SOLN
1000.0000 ug | Freq: Once | INTRAMUSCULAR | Status: AC
  Administered 2017-09-12: 1000 ug via INTRAMUSCULAR

## 2017-09-12 NOTE — Telephone Encounter (Signed)
Patient given results and instructions.  She will try to come in today to start the injections.

## 2017-09-12 NOTE — Telephone Encounter (Signed)
-----   Message from Alda Berthold, DO sent at 09/08/2017 12:13 PM EDT ----- Please inform patient that her electrolytes and muscle enzymes are normal, but she has vitamin B12 deficiency.  Start Vitamin B12 1064mcg IM injection daily x 7 days, weekly x 4 weeks, then monthly thereafter x 1 year. Thanks.

## 2017-09-13 ENCOUNTER — Ambulatory Visit (INDEPENDENT_AMBULATORY_CARE_PROVIDER_SITE_OTHER): Payer: 59 | Admitting: *Deleted

## 2017-09-13 DIAGNOSIS — E538 Deficiency of other specified B group vitamins: Secondary | ICD-10-CM | POA: Diagnosis not present

## 2017-09-13 MED ORDER — CYANOCOBALAMIN 1000 MCG/ML IJ SOLN
1000.0000 ug | Freq: Once | INTRAMUSCULAR | Status: AC
  Administered 2017-09-13: 1000 ug via INTRAMUSCULAR

## 2017-09-14 ENCOUNTER — Ambulatory Visit (INDEPENDENT_AMBULATORY_CARE_PROVIDER_SITE_OTHER): Payer: 59 | Admitting: Neurology

## 2017-09-14 ENCOUNTER — Ambulatory Visit (INDEPENDENT_AMBULATORY_CARE_PROVIDER_SITE_OTHER): Payer: 59 | Admitting: *Deleted

## 2017-09-14 DIAGNOSIS — G5621 Lesion of ulnar nerve, right upper limb: Secondary | ICD-10-CM | POA: Diagnosis not present

## 2017-09-14 DIAGNOSIS — M545 Low back pain: Secondary | ICD-10-CM | POA: Diagnosis not present

## 2017-09-14 DIAGNOSIS — E538 Deficiency of other specified B group vitamins: Secondary | ICD-10-CM

## 2017-09-14 DIAGNOSIS — R252 Cramp and spasm: Secondary | ICD-10-CM

## 2017-09-14 DIAGNOSIS — G8929 Other chronic pain: Secondary | ICD-10-CM

## 2017-09-14 MED ORDER — CYANOCOBALAMIN 1000 MCG/ML IJ SOLN
1000.0000 ug | Freq: Once | INTRAMUSCULAR | Status: AC
  Administered 2017-09-14: 1000 ug via INTRAMUSCULAR

## 2017-09-14 NOTE — Procedures (Signed)
[facility]  [place], [place]  [place], [place] [address] Tel: [phone] Fax:  [phone] Test Date:  09/14/2017  Patient: Jasmin Bonilla DOB: March 27, 1961 Physician: Narda Amber, DO  Sex: Female Height: 5\' 4"  Ref Phys: Narda Amber, DO  ID#: [id number] Temp: 33.3C Technician:    Patient Complaints: This is a 57 year old female referred for evaluation of generalized muscle cramps and spasm.  NCV & EMG Findings: Extensive electrodiagnostic testing of the right upper and lower extremity shows:  1. All sensory responses including the right median, ulnar, mixed Palmer, superficial peroneal, and sural nerves are within normal limits. 2. Right ulnar motor response shows conduction velocity slowing across the elbow (A Elbow-B Elbow, 48 m/s) with preserved amplitude and latency. Right median, tibial, and peroneal motor responses are within normal limits. 3. Right tibial H reflex study is within normal limits. 4. There is no evidence of active or chronic motor axon loss changes affecting any of the tested muscles. Motor unit configuration and recruitment pattern is within normal limits.  Impression: 1. Right ulnar neuropathy with slowing across the elbow, purely demyelinating in type and mild in degree electrically. 2. There is no evidence of a diffuse myopathy, cervical/lumbosacral radiculopathy, or sensorimotor polyneuropathy.   ___________________________ Narda Amber, DO    Nerve Conduction Studies Anti Sensory Summary Table   Site NR Peak (ms) Norm Peak (ms) P-T Amp (V) Norm P-T Amp  Right Median Anti Sensory (2nd Digit)  33.3C  Wrist    2.6 <3.6 42.2 >15  Right Sup Peroneal Anti Sensory (Ant Lat Mall)  33.3C  12 cm    2.7 <4.6 5.9 >4  Right Sural Anti Sensory (Lat Mall)  33.3C  Calf    3.0 <4.6 23.0 >4  Right Ulnar Anti Sensory (5th Digit)  33.3C  Wrist    2.3 <3.1 58.5 >10   Motor Summary Table   Site NR Onset (ms) Norm Onset (ms) O-P Amp (mV) Norm  O-P Amp Site1 Site2 Delta-0 (ms) Dist (cm) Vel (m/s) Norm Vel (m/s)  Right Median Motor (Abd Poll Brev)  33.3C  Wrist    2.7 <4.0 9.3 >6 Elbow Wrist 5.3 29.0 55 >50  Elbow    8.0  9.1         Right Peroneal Motor (Ext Dig Brev)  33.3C  Ankle    3.9 <6.0 4.4 >2.5 B Fib Ankle 6.5 35.0 54 >40  B Fib    10.4  4.3  Poplt B Fib 0.9 8.0 89 >40  Poplt    11.3  4.2         Right Tibial Motor (Abd Hall Brev)  33.3C  Ankle    4.8 <6.0 19.3 >4 Knee Ankle 5.5 36.0 65 >40  Knee    10.3  10.0         Right Ulnar Motor (Abd Dig Minimi)  33.3C  Wrist    1.8 <3.1 7.6 >7 B Elbow Wrist 3.2 22.0 69 >50  B Elbow    5.0  6.5  A Elbow B Elbow 2.1 10.0 48 >50  A Elbow    7.1  6.0          Comparison Summary Table   Site NR Peak (ms) Norm Peak (ms) P-T Amp (V) Site1 Site2 Delta-P (ms) Norm Delta (ms)  Right Median/Ulnar Palm Comparison (Wrist - 8cm)  33.3C  Median Palm    1.7 <2.2 45.5 Median Palm Ulnar Palm 0.3   Ulnar Palm  1.4 <2.2 33.0       H Reflex Studies   NR H-Lat (ms) Lat Norm (ms) L-R H-Lat (ms)  Right Tibial (Gastroc)  33.3C     30.88 <35    EMG   Side Muscle Ins Act Fibs Psw Fasc Number Recrt Dur Dur. Amp Amp. Poly Poly. Comment  Right 1stDorInt Nml Nml Nml Nml Nml Nml Nml Nml Nml Nml Nml Nml N/A  Right AntTibialis Nml Nml Nml Nml Nml Nml Nml Nml Nml Nml Nml Nml N/A  Right Gastroc Nml Nml Nml Nml Nml Nml Nml Nml Nml Nml Nml Nml N/A  Right Flex Dig Long Nml Nml Nml Nml Nml Nml Nml Nml Nml Nml Nml Nml N/A  Right RectFemoris Nml Nml Nml Nml Nml Nml Nml Nml Nml Nml Nml Nml N/A  Right GluteusMed Nml Nml Nml Nml Nml Nml Nml Nml Nml Nml Nml Nml N/A  Right BicepsFemS Nml Nml Nml Nml Nml Nml Nml Nml Nml Nml Nml Nml N/A  Right PronatorTeres Nml Nml Nml Nml Nml Nml Nml Nml Nml Nml Nml Nml N/A  Right Biceps Nml Nml Nml Nml Nml Nml Nml Nml Nml Nml Nml Nml N/A  Right Triceps Nml Nml Nml Nml Nml Nml Nml Nml Nml Nml Nml Nml N/A  Right Deltoid Nml Nml Nml Nml Nml Nml Nml Nml Nml Nml Nml Nml N/A        Waveforms:

## 2017-09-14 NOTE — Progress Notes (Signed)
Referral sent 

## 2017-09-14 NOTE — Addendum Note (Signed)
Addended by: Chester Holstein on: 09/14/2017 12:45 PM   Modules accepted: Orders

## 2017-09-14 NOTE — Progress Notes (Signed)
Follow-up Visit   Date: 09/14/17    Jasmin Bonilla MRN: [id number] DOB: 1960/07/19   Interim History: Jasmin Bonilla is a 57 y.o. right-handed Caucasian female with emphysema and tobacco abuse returning to the clinic for follow-up of muscle cramps.  The patient was accompanied to the clinic by self.    History of present illness:  For the past several years, she has been having muscle spasm of the legs, back, chest, and hands.  She gets relief with heat.  They occur sporadically a few times per month, lasting 5-15 minutes.  Spasms are worse in the legs and exacerbated by caffeinated beverages and chocolate.  Sometimes, her pain is severe and limits her ability to walk.  She does not have distortion of the hands, toes, or legs when this occurs, and she describes is more as a "severe pain".    Previously tried flexeril and magnesium.  Recently started on ropinirole 0.25mg  and tizanidine 2mg  which she tried for a month and stopped due to no benefit.   She has chronic low back pain.  She has history of degenerative disc disease and has been followed by Dr. Maxie Better at [facility].  She reports having MRI lumbar spine several years ago and was told she needed surgery.  I do not have any imaging records. She broke her right ankle and lower leg in 2012 after falling from a ladder.  She had physical therapy after her injury.    UPDATE 09/14/2017:  Her labs shows vitamin B 12 deficiency for which she is getting injections. She has noticed mild improvement with the intensity of her cramps.  She continues to complain achy pain involving the mid lower back.  She denies any new weakness, numbness, or tingling. She is here to undergo electrodiagnostic testing of the right side.MRI lumbar spine from July 2013 was received and does not show any nerve impingement.  Medications:  Current Outpatient Medications on File Prior to Visit  Medication Sig Dispense Refill  . baclofen (LIORESAL) 10 MG tablet  Take half tablet at bedtime x 2 weeks, then increase to 1 tablet at bedtime 30 each 5  . budesonide-formoterol (SYMBICORT) 160-4.5 MCG/ACT inhaler Inhale 2 puffs into the lungs 2 (two) times daily. 1 Inhaler 0  . Calcium Carb-Cholecalciferol (CALCIUM + D3 PO) Take by mouth.    . diphenhydrAMINE (BENADRYL) 25 MG tablet Take 25 mg by mouth every 8 (eight) hours as needed. For allergies    . fexofenadine (ALLEGRA) 180 MG tablet Take 180 mg by mouth daily.    . fluticasone (FLONASE) 50 MCG/ACT nasal spray Place 1 spray into both nostrils daily.    Marland Kitchen MAGNESIUM PO Take by mouth.    . ranitidine (ZANTAC) 150 MG tablet Take 150 mg by mouth daily as needed. For heartburn    . traMADol (ULTRAM) 50 MG tablet Take 50 mg by mouth every 6 (six) hours as needed.     No current facility-administered medications on file prior to visit.     Allergies:  Allergies  Allergen Reactions  . Erythromycin Other (See Comments)    GI upset    Neurological Exam: MENTAL STATUS including orientation to time, place, person, recent and remote memory, attention span and concentration, language, and fund of knowledge is normal.  Speech is not dysarthric.  CRANIAL NERVES:  Face is symmetric.   MOTOR:  Motor strength is 5/5 in all extremities  COORDINATION/GAIT:   Gait narrow based and stable.   Data:  MRI lumbar spine 10/24/2011: 1.  Mild lateralrecess and foraminal stenosis at L3-4 and L4-5. 2.  Mild chronic L2 and L3 wedge compression deformities. 3.  Probable left renal cysts, larger measuring 1.7 cm  NCS/EMG of the right side 09/14/2017: 1. Right ulnar neuropathy with slowing across the elbow, purely demyelinating in type and mild in degree electrically. 2. There is no evidence of a diffuse myopathy, cervical/lumbosacral radiculopathy, or sensorimotor polyneuropathy.  IMPRESSION/PLAN: 1.  Chronic low back pain   Reviewed the results of patient's EMG which does not show myopathy or radiculopathy  Recommend  starting physical therapy for low back strengthening and stretching.   2.  Muscle cramps  Encouraged to stay well-hydrated  Continue baclofen 5 mg at bedtime then titrate to 10 mg in 2 weeks  3.  Vitamin B 12 deficiency  Continue vitamin B 12 injections - administered today  4.  Right ulnar neuropathy across the elbow (mild), asymptomatic so will follow  Return to clinic in 4 months   Thank you for allowing me to participate in patient's care.  If I can answer any additional questions, I would be pleased to do so.    Sincerely,    Donika K. Posey Pronto, DO

## 2017-09-15 ENCOUNTER — Ambulatory Visit (INDEPENDENT_AMBULATORY_CARE_PROVIDER_SITE_OTHER): Payer: 59 | Admitting: *Deleted

## 2017-09-15 DIAGNOSIS — E538 Deficiency of other specified B group vitamins: Secondary | ICD-10-CM | POA: Diagnosis not present

## 2017-09-15 MED ORDER — CYANOCOBALAMIN 1000 MCG/ML IJ SOLN
1000.0000 ug | Freq: Once | INTRAMUSCULAR | Status: AC
  Administered 2017-09-15: 1000 ug via INTRAMUSCULAR

## 2017-09-19 ENCOUNTER — Ambulatory Visit: Payer: 59

## 2017-09-20 ENCOUNTER — Ambulatory Visit (INDEPENDENT_AMBULATORY_CARE_PROVIDER_SITE_OTHER): Payer: 59 | Admitting: *Deleted

## 2017-09-20 DIAGNOSIS — E538 Deficiency of other specified B group vitamins: Secondary | ICD-10-CM | POA: Diagnosis not present

## 2017-09-20 MED ORDER — CYANOCOBALAMIN 1000 MCG/ML IJ SOLN
1000.0000 ug | Freq: Once | INTRAMUSCULAR | Status: AC
  Administered 2017-09-20: 1000 ug via INTRAMUSCULAR

## 2017-09-26 ENCOUNTER — Ambulatory Visit (INDEPENDENT_AMBULATORY_CARE_PROVIDER_SITE_OTHER): Payer: 59 | Admitting: *Deleted

## 2017-09-26 DIAGNOSIS — E538 Deficiency of other specified B group vitamins: Secondary | ICD-10-CM

## 2017-09-26 MED ORDER — CYANOCOBALAMIN 1000 MCG/ML IJ SOLN
1000.0000 ug | Freq: Once | INTRAMUSCULAR | Status: AC
  Administered 2017-09-26: 1000 ug via INTRAMUSCULAR

## 2017-09-29 ENCOUNTER — Ambulatory Visit: Payer: BLUE CROSS/BLUE SHIELD | Admitting: Neurology

## 2017-10-10 ENCOUNTER — Ambulatory Visit (INDEPENDENT_AMBULATORY_CARE_PROVIDER_SITE_OTHER): Payer: 59 | Admitting: *Deleted

## 2017-10-10 DIAGNOSIS — E538 Deficiency of other specified B group vitamins: Secondary | ICD-10-CM | POA: Diagnosis not present

## 2017-10-10 MED ORDER — CYANOCOBALAMIN 1000 MCG/ML IJ SOLN
1000.0000 ug | Freq: Once | INTRAMUSCULAR | Status: AC
  Administered 2017-10-10: 1000 ug via INTRAMUSCULAR

## 2017-10-18 ENCOUNTER — Ambulatory Visit (INDEPENDENT_AMBULATORY_CARE_PROVIDER_SITE_OTHER): Payer: 59 | Admitting: *Deleted

## 2017-10-18 DIAGNOSIS — E538 Deficiency of other specified B group vitamins: Secondary | ICD-10-CM | POA: Diagnosis not present

## 2017-10-18 MED ORDER — CYANOCOBALAMIN 1000 MCG/ML IJ SOLN
1000.0000 ug | Freq: Once | INTRAMUSCULAR | Status: AC
  Administered 2017-10-18: 1000 ug via INTRAMUSCULAR

## 2017-10-19 DIAGNOSIS — G5620 Lesion of ulnar nerve, unspecified upper limb: Secondary | ICD-10-CM | POA: Diagnosis not present

## 2017-10-19 DIAGNOSIS — Z1211 Encounter for screening for malignant neoplasm of colon: Secondary | ICD-10-CM | POA: Diagnosis not present

## 2017-10-19 DIAGNOSIS — E538 Deficiency of other specified B group vitamins: Secondary | ICD-10-CM | POA: Diagnosis not present

## 2017-10-19 DIAGNOSIS — M62838 Other muscle spasm: Secondary | ICD-10-CM | POA: Diagnosis not present

## 2017-10-27 ENCOUNTER — Ambulatory Visit (INDEPENDENT_AMBULATORY_CARE_PROVIDER_SITE_OTHER): Payer: 59 | Admitting: *Deleted

## 2017-10-27 DIAGNOSIS — E538 Deficiency of other specified B group vitamins: Secondary | ICD-10-CM | POA: Diagnosis not present

## 2017-10-27 MED ORDER — CYANOCOBALAMIN 1000 MCG/ML IJ SOLN
1000.0000 ug | Freq: Once | INTRAMUSCULAR | Status: AC
  Administered 2017-10-27: 1000 ug via INTRAMUSCULAR

## 2017-11-07 ENCOUNTER — Ambulatory Visit (INDEPENDENT_AMBULATORY_CARE_PROVIDER_SITE_OTHER): Payer: 59 | Admitting: *Deleted

## 2017-11-07 DIAGNOSIS — E538 Deficiency of other specified B group vitamins: Secondary | ICD-10-CM

## 2017-11-07 MED ORDER — CYANOCOBALAMIN 1000 MCG/ML IJ SOLN
1000.0000 ug | Freq: Once | INTRAMUSCULAR | Status: AC
  Administered 2017-11-07: 1000 ug via INTRAMUSCULAR

## 2017-12-07 ENCOUNTER — Ambulatory Visit (INDEPENDENT_AMBULATORY_CARE_PROVIDER_SITE_OTHER): Payer: 59 | Admitting: *Deleted

## 2017-12-07 DIAGNOSIS — E538 Deficiency of other specified B group vitamins: Secondary | ICD-10-CM

## 2017-12-07 MED ORDER — CYANOCOBALAMIN 1000 MCG/ML IJ SOLN
1000.0000 ug | Freq: Once | INTRAMUSCULAR | Status: AC
  Administered 2017-12-07: 1000 ug via INTRAMUSCULAR

## 2017-12-12 DIAGNOSIS — Z23 Encounter for immunization: Secondary | ICD-10-CM | POA: Diagnosis not present

## 2017-12-12 DIAGNOSIS — T148XXA Other injury of unspecified body region, initial encounter: Secondary | ICD-10-CM | POA: Diagnosis not present

## 2018-01-08 ENCOUNTER — Ambulatory Visit (INDEPENDENT_AMBULATORY_CARE_PROVIDER_SITE_OTHER): Payer: 59 | Admitting: *Deleted

## 2018-01-08 DIAGNOSIS — E538 Deficiency of other specified B group vitamins: Secondary | ICD-10-CM

## 2018-01-08 MED ORDER — CYANOCOBALAMIN 1000 MCG/ML IJ SOLN
1000.0000 ug | Freq: Once | INTRAMUSCULAR | Status: AC
  Administered 2018-01-08: 1000 ug via INTRAMUSCULAR

## 2018-03-21 ENCOUNTER — Ambulatory Visit (INDEPENDENT_AMBULATORY_CARE_PROVIDER_SITE_OTHER): Payer: 59 | Admitting: Neurology

## 2018-03-21 DIAGNOSIS — E538 Deficiency of other specified B group vitamins: Secondary | ICD-10-CM

## 2018-03-21 MED ORDER — CYANOCOBALAMIN 1000 MCG/ML IJ SOLN
1000.0000 ug | Freq: Once | INTRAMUSCULAR | Status: AC
  Administered 2018-03-21: 1000 ug via INTRAMUSCULAR

## 2018-03-21 NOTE — Progress Notes (Signed)
B12 injection to left deltoid with no apparent complications.   

## 2018-04-17 DIAGNOSIS — J40 Bronchitis, not specified as acute or chronic: Secondary | ICD-10-CM | POA: Diagnosis not present

## 2018-05-01 ENCOUNTER — Ambulatory Visit
Admission: RE | Admit: 2018-05-01 | Discharge: 2018-05-01 | Disposition: A | Discharge status: Home / Self Care | Payer: 59 | Source: Ambulatory Visit | Attending: Family Medicine | Admitting: Family Medicine

## 2018-05-01 ENCOUNTER — Other Ambulatory Visit: Payer: Self-pay | Admitting: Family Medicine

## 2018-05-01 DIAGNOSIS — J40 Bronchitis, not specified as acute or chronic: Secondary | ICD-10-CM | POA: Diagnosis not present

## 2018-05-01 DIAGNOSIS — F17200 Nicotine dependence, unspecified, uncomplicated: Secondary | ICD-10-CM | POA: Diagnosis not present

## 2018-05-01 DIAGNOSIS — J189 Pneumonia, unspecified organism: Secondary | ICD-10-CM | POA: Diagnosis not present

## 2018-05-04 ENCOUNTER — Ambulatory Visit
Admission: RE | Admit: 2018-05-04 | Discharge: 2018-05-04 | Disposition: A | Discharge status: Home / Self Care | Payer: 59 | Source: Ambulatory Visit | Attending: Family Medicine | Admitting: Family Medicine

## 2018-05-04 ENCOUNTER — Other Ambulatory Visit: Payer: Self-pay | Admitting: Family Medicine

## 2018-05-04 DIAGNOSIS — J189 Pneumonia, unspecified organism: Secondary | ICD-10-CM | POA: Diagnosis not present

## 2018-05-04 DIAGNOSIS — R079 Chest pain, unspecified: Secondary | ICD-10-CM

## 2018-05-04 MED ORDER — IOPAMIDOL (ISOVUE-370) INJECTION 76%
75.0000 mL | Freq: Once | INTRAVENOUS | Status: AC | PRN
  Administered 2018-05-04: 75 mL via INTRAVENOUS

## 2018-05-15 DIAGNOSIS — M940 Chondrocostal junction syndrome [Tietze]: Secondary | ICD-10-CM | POA: Diagnosis not present

## 2018-05-15 DIAGNOSIS — M791 Myalgia, unspecified site: Secondary | ICD-10-CM | POA: Diagnosis not present

## 2018-05-15 DIAGNOSIS — R091 Pleurisy: Secondary | ICD-10-CM | POA: Diagnosis not present

## 2018-08-22 DIAGNOSIS — S39012A Strain of muscle, fascia and tendon of lower back, initial encounter: Secondary | ICD-10-CM | POA: Diagnosis not present

## 2019-08-20 IMAGING — CT CT ANGIO CHEST
1 of 2 series · 19 of 32 positions shown · IV contrast (APPLIED)
Comparison: Radiography 05/01/2018

CLINICAL DATA: Shortness of breath. Bronchitis. Pneumonia. Chest
pain.

EXAM:
CT ANGIOGRAPHY CHEST WITH CONTRAST
TECHNIQUE: Multidetector CT imaging of the chest was performed using the
standard protocol during bolus administration of intravenous
contrast. Multiplanar CT image reconstructions and MIPs were
obtained to evaluate the vascular anatomy.
CONTRAST:  75mL [id number] IOPAMIDOL ([id number]) INJECTION 76%

[Series 9: thins 1.0 b31s · axial · 0.81mm/px · z∈[-322,-15]mm · 19 of 343 slices shown]
[im 18/343  lung]
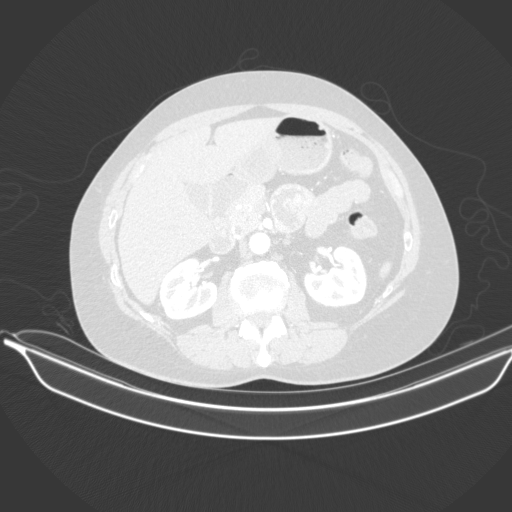
[im 35/343  mediastinal]
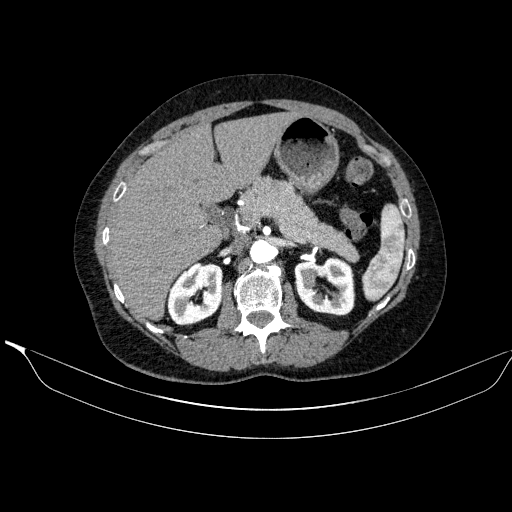
[im 52/343  lung]
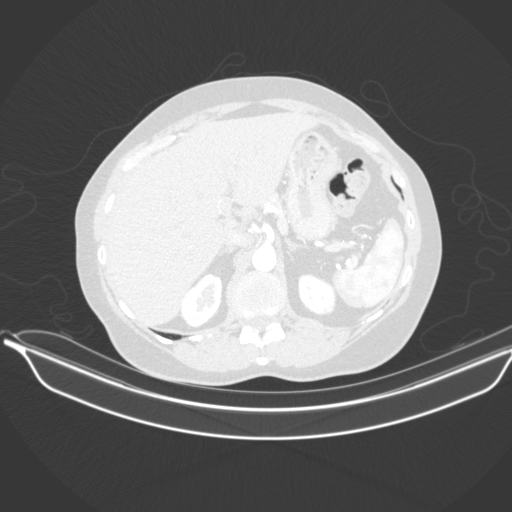
[im 86/343  mediastinal]
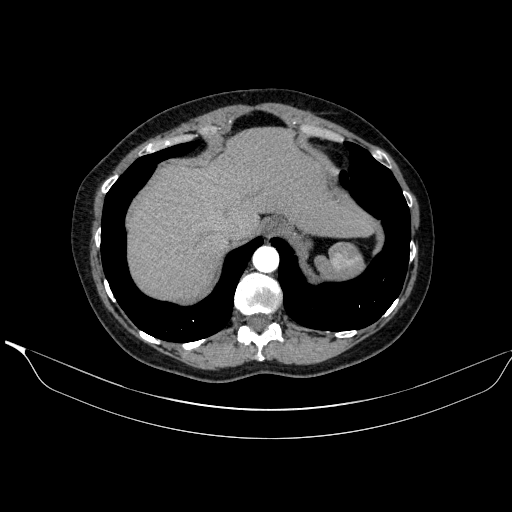
[im 103/343  lung]
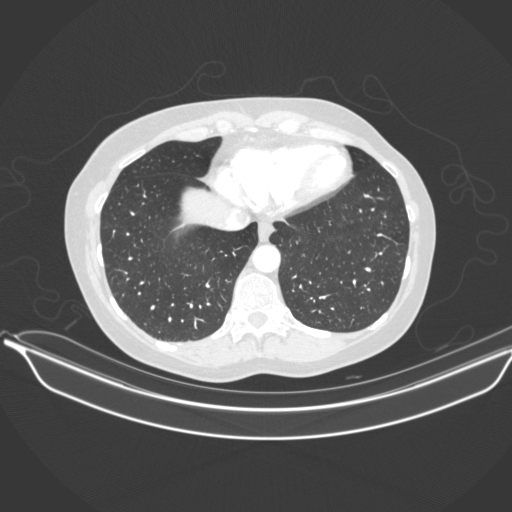
[im 115/343  mediastinal]
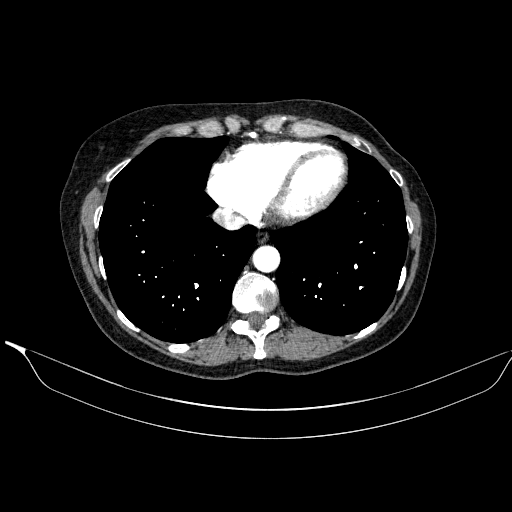
[im 120/343  lung]
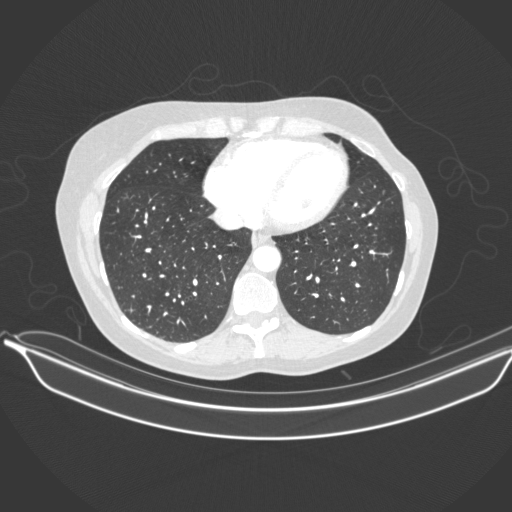
[im 137/343  mediastinal]
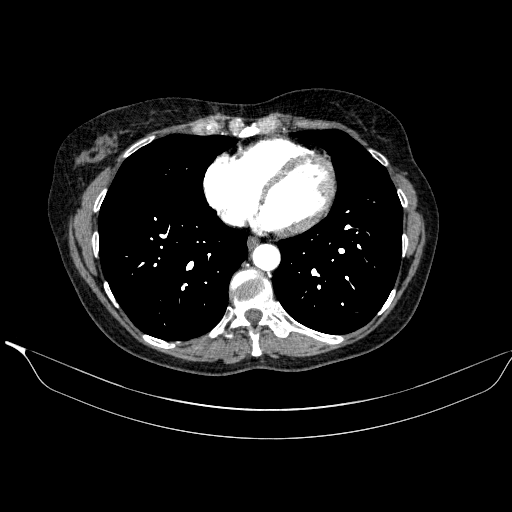
[im 154/343  lung]
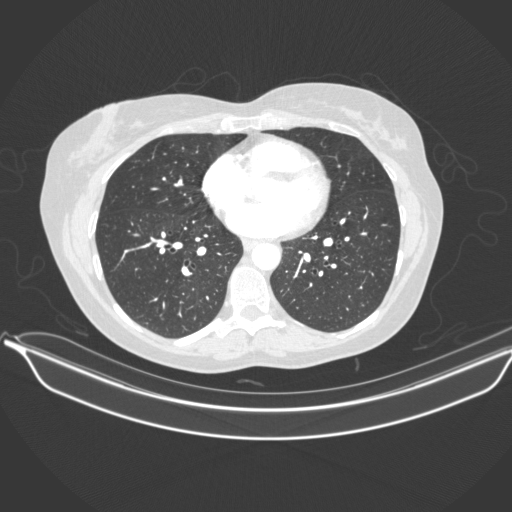
[im 172/343  mediastinal]
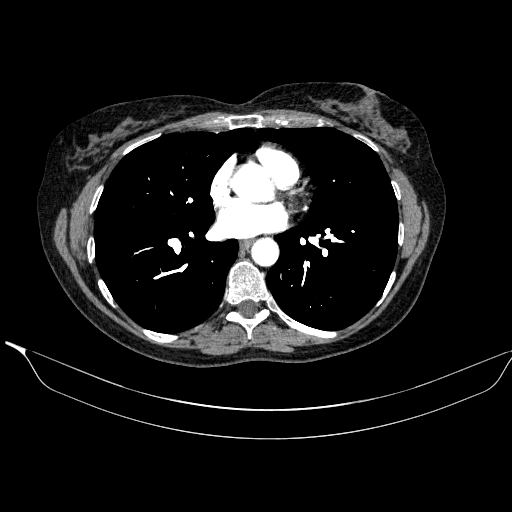
[im 189/343  lung]
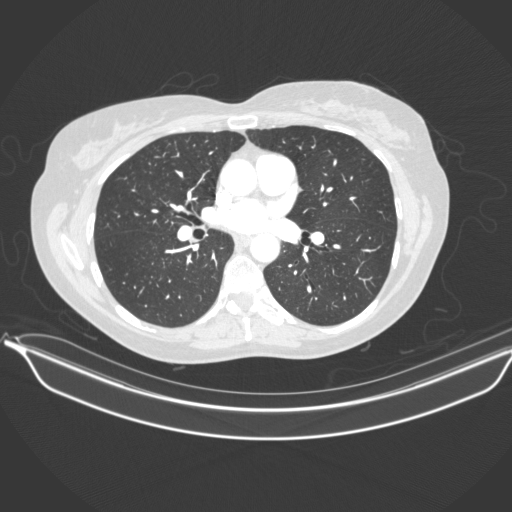
[im 206/343  mediastinal]
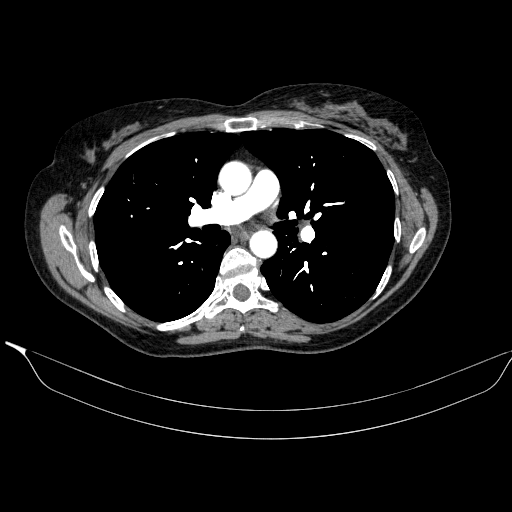
[im 223/343  lung]
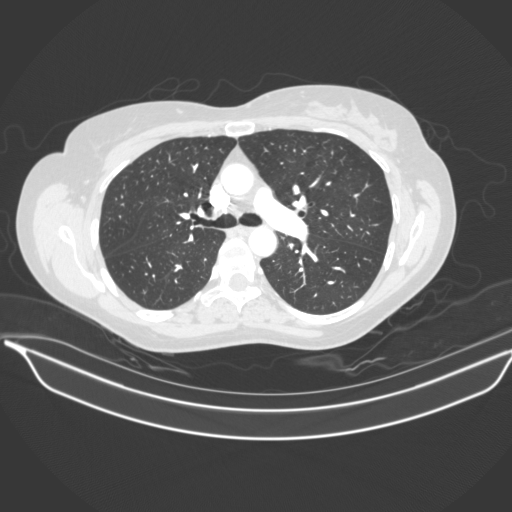
[im 229/343  mediastinal]
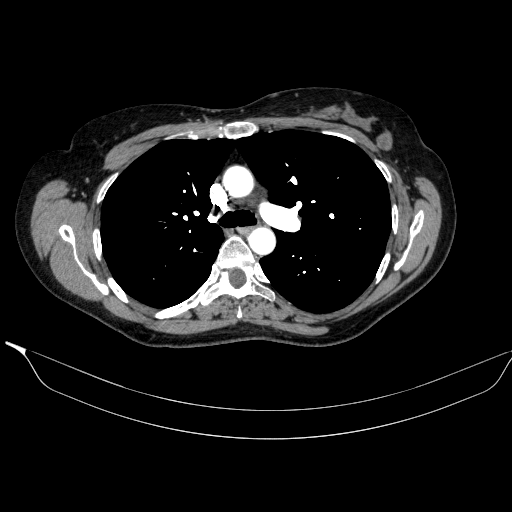
[im 240/343  lung]
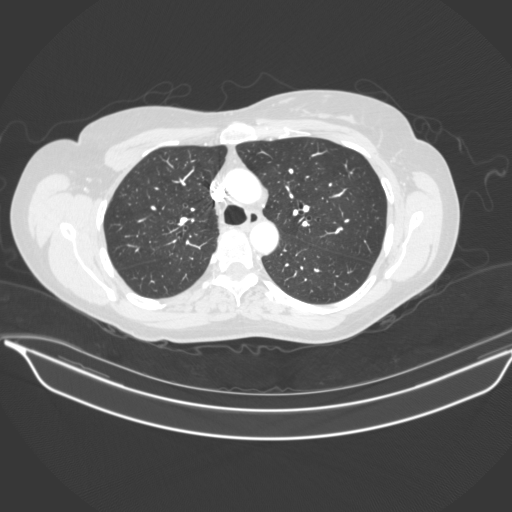
[im 257/343  mediastinal]
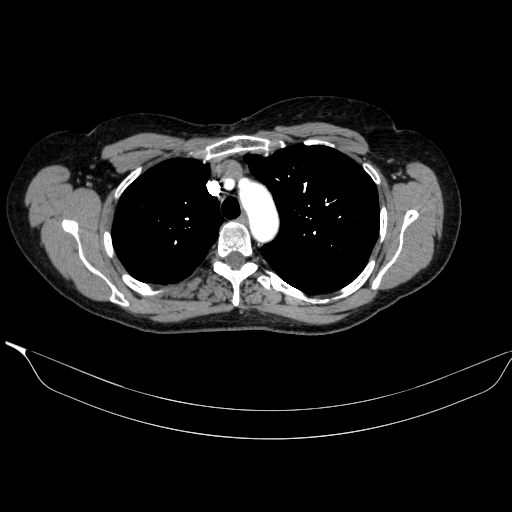
[im 291/343  lung]
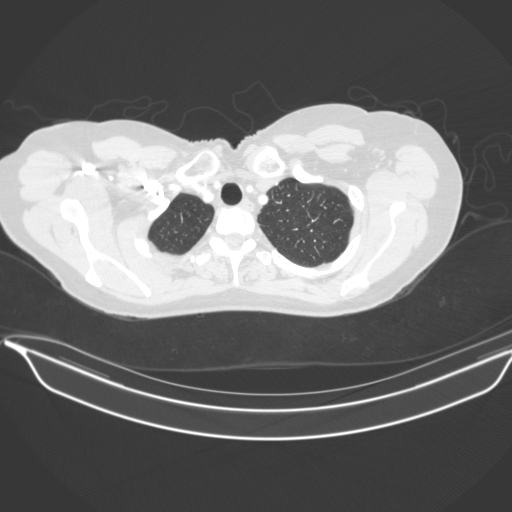
[im 308/343  mediastinal]
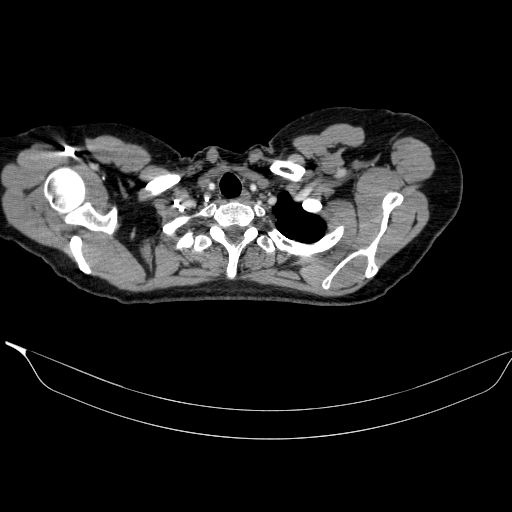
[im 325/343  lung]
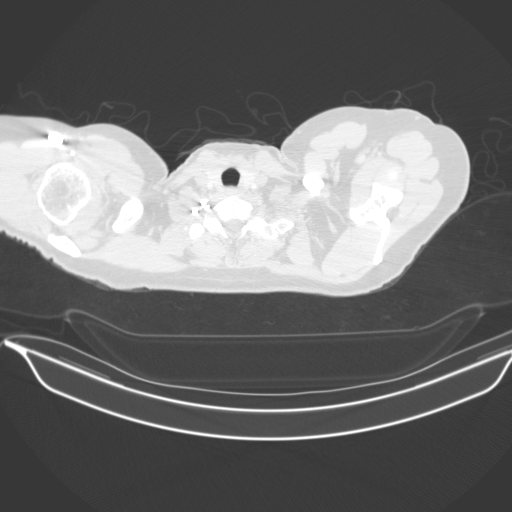

[19 of 32 positions shown; findings below may reference images not displayed]

FINDINGS: Cardiovascular: Pulmonary arterial opacification is excellent. There
are no pulmonary emboli. Heart size is normal. No visible coronary
artery calcification. No aortic atherosclerotic changes seen.

Mediastinum/Nodes: No mass or adenopathy.

Lungs/Pleura: The lungs are clear. No evidence of emphysema or
fibrosis. No infiltrate, mass, effusion or collapse.

Upper Abdomen: Normal

Musculoskeletal: Old partial fractures at L1 and L2, not primarily
evaluated.

Review of the MIP images confirms the above findings.
IMPRESSION: 1. No pulmonary emboli or other acute chest pathology.
2. Old partial fractures of L1 and L2, not primarily evaluated.

## 2019-12-01 ENCOUNTER — Other Ambulatory Visit: Payer: Self-pay

## 2019-12-01 ENCOUNTER — Emergency Department (HOSPITAL_COMMUNITY)
Admission: EM | Admit: 2019-12-01 | Discharge: 2019-12-01 | Disposition: A | Discharge status: Home / Self Care | Payer: 59 | Attending: Emergency Medicine | Admitting: Emergency Medicine

## 2019-12-01 ENCOUNTER — Encounter (HOSPITAL_COMMUNITY): Payer: Self-pay | Admitting: Emergency Medicine

## 2019-12-01 DIAGNOSIS — Z5321 Procedure and treatment not carried out due to patient leaving prior to being seen by health care provider: Secondary | ICD-10-CM | POA: Insufficient documentation

## 2019-12-01 DIAGNOSIS — R109 Unspecified abdominal pain: Secondary | ICD-10-CM | POA: Insufficient documentation

## 2019-12-01 NOTE — ED Notes (Signed)
Pt left triage and advised she is going home.  Encouraged pt to stay but declined.  Pt will see PCP in the morning.

## 2019-12-01 NOTE — ED Triage Notes (Signed)
Patient reports right flank pain this evening , no hematuria or dysuria , denies injury , no fever or chills .

## 2019-12-02 ENCOUNTER — Other Ambulatory Visit: Payer: Self-pay | Admitting: Family Medicine

## 2019-12-02 ENCOUNTER — Ambulatory Visit
Admission: RE | Admit: 2019-12-02 | Discharge: 2019-12-02 | Disposition: A | Discharge status: Home / Self Care | Payer: Self-pay | Source: Ambulatory Visit | Attending: Family Medicine | Admitting: Family Medicine

## 2019-12-02 DIAGNOSIS — R3129 Other microscopic hematuria: Secondary | ICD-10-CM

## 2019-12-02 LAB — CBC WITH DIFFERENTIAL/PLATELET
Abs Immature Granulocytes: 0.04 10*3/uL (ref 0.00–0.07)
Basophils Absolute: 0.1 10*3/uL (ref 0.0–0.1)
Basophils Relative: 0 %
Eosinophils Absolute: 0.1 10*3/uL (ref 0.0–0.5)
Eosinophils Relative: 1 %
HCT: 42.6 % (ref 36.0–46.0)
Hemoglobin: 13.6 g/dL (ref 12.0–15.0)
Immature Granulocytes: 0 %
Lymphocytes Relative: 8 %
Lymphs Abs: 1.2 10*3/uL (ref 0.7–4.0)
MCH: 28.9 pg (ref 26.0–34.0)
MCHC: 31.9 g/dL (ref 30.0–36.0)
MCV: 90.6 fL (ref 80.0–100.0)
Monocytes Absolute: 0.8 10*3/uL (ref 0.1–1.0)
Monocytes Relative: 5 %
Neutro Abs: 12.5 10*3/uL — ABNORMAL HIGH (ref 1.7–7.7)
Neutrophils Relative %: 86 %
Platelets: 317 10*3/uL (ref 150–400)
RBC: 4.7 MIL/uL (ref 3.87–5.11)
RDW: 13.4 % (ref 11.5–15.5)
WBC: 14.6 10*3/uL — ABNORMAL HIGH (ref 4.0–10.5)
nRBC: 0 % (ref 0.0–0.2)

## 2019-12-02 LAB — BASIC METABOLIC PANEL
Anion gap: 11 (ref 5–15)
BUN: 13 mg/dL (ref 6–20)
CO2: 28 mmol/L (ref 22–32)
Calcium: 9.5 mg/dL (ref 8.9–10.3)
Chloride: 103 mmol/L (ref 98–111)
Creatinine, Ser: 1.11 mg/dL — ABNORMAL HIGH (ref 0.44–1.00)
GFR calc Af Amer: >60 mL/min (ref >60)
GFR calc non Af Amer: 54 mL/min — ABNORMAL LOW (ref >60)
Glucose, Bld: 127 mg/dL — ABNORMAL HIGH (ref 70–99)
Potassium: 4.4 mmol/L (ref 3.5–5.1)
Sodium: 142 mmol/L (ref 135–145)

## 2021-05-06 ENCOUNTER — Other Ambulatory Visit: Payer: Self-pay | Admitting: Family Medicine

## 2021-05-06 DIAGNOSIS — Z1231 Encounter for screening mammogram for malignant neoplasm of breast: Secondary | ICD-10-CM

## 2021-06-06 ENCOUNTER — Emergency Department (HOSPITAL_BASED_OUTPATIENT_CLINIC_OR_DEPARTMENT_OTHER): Payer: No Typology Code available for payment source | Admitting: Radiology

## 2021-06-06 ENCOUNTER — Encounter (HOSPITAL_BASED_OUTPATIENT_CLINIC_OR_DEPARTMENT_OTHER): Payer: Self-pay | Admitting: Emergency Medicine

## 2021-06-06 ENCOUNTER — Emergency Department (HOSPITAL_BASED_OUTPATIENT_CLINIC_OR_DEPARTMENT_OTHER)
Admission: EM | Admit: 2021-06-06 | Discharge: 2021-06-06 | Disposition: A | Discharge status: Home / Self Care | Payer: No Typology Code available for payment source | Attending: Emergency Medicine | Admitting: Emergency Medicine

## 2021-06-06 ENCOUNTER — Other Ambulatory Visit: Payer: Self-pay

## 2021-06-06 DIAGNOSIS — R0789 Other chest pain: Secondary | ICD-10-CM | POA: Insufficient documentation

## 2021-06-06 LAB — CBC WITH DIFFERENTIAL/PLATELET
Abs Immature Granulocytes: 0.01 10*3/uL (ref 0.00–0.07)
Basophils Absolute: 0 10*3/uL (ref 0.0–0.1)
Basophils Relative: 1 %
Eosinophils Absolute: 0.1 10*3/uL (ref 0.0–0.5)
Eosinophils Relative: 2 %
HCT: 40.8 % (ref 36.0–46.0)
Hemoglobin: 13.6 g/dL (ref 12.0–15.0)
Immature Granulocytes: 0 %
Lymphocytes Relative: 20 %
Lymphs Abs: 1.1 10*3/uL (ref 0.7–4.0)
MCH: 30.6 pg (ref 26.0–34.0)
MCHC: 33.3 g/dL (ref 30.0–36.0)
MCV: 91.9 fL (ref 80.0–100.0)
Monocytes Absolute: 0.5 10*3/uL (ref 0.1–1.0)
Monocytes Relative: 9 %
Neutro Abs: 3.9 10*3/uL (ref 1.7–7.7)
Neutrophils Relative %: 68 %
Platelets: 195 10*3/uL (ref 150–400)
RBC: 4.44 MIL/uL (ref 3.87–5.11)
RDW: 13.9 % (ref 11.5–15.5)
WBC: 5.6 10*3/uL (ref 4.0–10.5)
nRBC: 0 % (ref 0.0–0.2)

## 2021-06-06 LAB — COMPREHENSIVE METABOLIC PANEL
ALT: 14 U/L (ref 0–44)
AST: 16 U/L (ref 15–41)
Albumin: 4.2 g/dL (ref 3.5–5.0)
Alkaline Phosphatase: 55 U/L (ref 38–126)
Anion gap: 10 (ref 5–15)
BUN: 14 mg/dL (ref 8–23)
CO2: 24 mmol/L (ref 22–32)
Calcium: 9.1 mg/dL (ref 8.9–10.3)
Chloride: 104 mmol/L (ref 98–111)
Creatinine, Ser: 0.66 mg/dL (ref 0.44–1.00)
GFR, Estimated: >60 mL/min (ref >60)
Glucose, Bld: 93 mg/dL (ref 70–99)
Potassium: 3.8 mmol/L (ref 3.5–5.1)
Sodium: 138 mmol/L (ref 135–145)
Total Bilirubin: 0.6 mg/dL (ref 0.3–1.2)
Total Protein: 6.5 g/dL (ref 6.5–8.1)

## 2021-06-06 LAB — LIPASE, BLOOD: Lipase: 50 U/L (ref 11–51)

## 2021-06-06 LAB — TROPONIN I (HIGH SENSITIVITY): Troponin I (High Sensitivity): <2 ng/L (ref <18)

## 2021-06-06 MED ORDER — ALUM & MAG HYDROXIDE-SIMETH 200-200-20 MG/5ML PO SUSP
30.0000 mL | Freq: Once | ORAL | Status: AC
  Administered 2021-06-06: 30 mL via ORAL
  Filled 2021-06-06: qty 30

## 2021-06-06 MED ORDER — IBUPROFEN 600 MG PO TABS: 600.0000 mg | ORAL_TABLET | Freq: Four times a day (QID) | ORAL | 0 refills | Status: DC | PRN

## 2021-06-06 MED ORDER — KETOROLAC TROMETHAMINE 15 MG/ML IJ SOLN
15.0000 mg | Freq: Once | INTRAMUSCULAR | Status: AC
  Administered 2021-06-06: 15 mg via INTRAVENOUS
  Filled 2021-06-06: qty 1

## 2021-06-06 MED ORDER — LIDOCAINE VISCOUS HCL 2 % MT SOLN
15.0000 mL | Freq: Once | OROMUCOSAL | Status: AC
  Administered 2021-06-06: 15 mL via ORAL
  Filled 2021-06-06: qty 15

## 2021-06-06 MED ORDER — FAMOTIDINE IN NACL 20-0.9 MG/50ML-% IV SOLN
20.0000 mg | Freq: Once | INTRAVENOUS | Status: AC
  Administered 2021-06-06: 20 mg via INTRAVENOUS
  Filled 2021-06-06: qty 50

## 2021-06-06 MED ORDER — LIDOCAINE 5 % EX PTCH: 1.0000 | MEDICATED_PATCH | Freq: Every day | CUTANEOUS | 0 refills | Status: DC | PRN

## 2021-06-06 MED ORDER — LIDOCAINE 5 % EX PTCH
1.0000 | MEDICATED_PATCH | CUTANEOUS | Status: DC
  Filled 2021-06-06: qty 1

## 2021-06-06 MED ORDER — SODIUM CHLORIDE 0.9 % IV BOLUS
1000.0000 mL | Freq: Once | INTRAVENOUS | Status: AC
  Administered 2021-06-06: 1000 mL via INTRAVENOUS

## 2021-06-06 MED ORDER — METHOCARBAMOL 500 MG PO TABS: 1000.0000 mg | ORAL_TABLET | Freq: Two times a day (BID) | ORAL | 0 refills | Status: AC

## 2021-06-06 NOTE — ED Provider Notes (Signed)
[facility] EMERGENCY DEPT Provider Note   CSN: [id number] Arrival date & time: 06/06/21  0901     History  Chief Complaint  Patient presents with   Chest Pain    Jasmin Bonilla is a 61 y.o. female.  This is a 61 y.o. female with significant medical history as below, including anxiety, GERD who presents to the ED with complaint of right-sided chest wall discomfort.  Location: Right lower chest wall Duration: Since yesterday around 1 to 2 PM Onset: Gradual Timing: Intermittent Description: Sharp, stabbing, not radiating Severity: Mild Exacerbating/Alleviating Factors: Worse when twisting torso, lifting arms, receiving a hug Associated Symptoms: None, she took antacid x1 yesterday which did improve her symptoms Pertinent Negatives: No dyspnea, nausea or vomiting, no numbness or tingling, no rashes, no trauma, no abdominal pain.  No change in bowel or bladder function.   Patient does report similar symptoms when she was diagnosed with pneumonia in the past.  She denies cough, congestion, rhinorrhea.  No fevers or chills.  No sick contacts or recent travels     Past Medical History: No date: Anxiety     Comment:  hx. anxiety attacks No date: Degenerative disc disease, lumbar No date: GERD (gastroesophageal reflux disease)     Comment:  occ. heartburn No date: Rash     Comment:  on back - states may be from heating pad use No date: Tibia/fibula fracture     Comment:  right tibial pilon and fib. fx.  Past Surgical History: 16 yrs. ago: [place] 03/15/2011: ORIF FIBULA FRACTURE     Comment:  Procedure: OPEN REDUCTION INTERNAL FIXATION (ORIF)               FIBULA FRACTURE;  Surgeon: Wylene Simmer, MD;  Location:               [facility];  Service: Orthopedics;                Laterality: Right;  right fibula and tibia pilon               fractures and possible heel cord lengthening    No language interpreter was used.       Home Medications Prior to Admission medications   Medication Sig Start Date End Date Taking? Authorizing Provider  ibuprofen (ADVIL) 600 MG tablet Take 1 tablet (600 mg total) by mouth every 6 (six) hours as needed. 06/06/21  Yes Wynona Dove A, DO  lidocaine (LIDODERM) 5 % Place 1 patch onto the skin daily as needed. Remove & Discard patch within 12 hours or as directed by MD 06/06/21  Yes Jeanell Sparrow, DO  methocarbamol (ROBAXIN) 500 MG tablet Take 2 tablets (1,000 mg total) by mouth 2 (two) times daily for 5 days. 06/06/21 06/11/21 Yes Jeanell Sparrow, DO  baclofen (LIORESAL) 10 MG tablet Take half tablet at bedtime x 2 weeks, then increase to 1 tablet at bedtime 09/07/17   Narda Amber K, DO  budesonide-formoterol [facility]) 160-4.5 MCG/ACT inhaler Inhale 2 puffs into the lungs 2 (two) times daily. 05/03/16   Tanda Rockers, MD  Calcium Carb-Cholecalciferol (CALCIUM + D3 PO) Take by mouth.    [provider]  diphenhydrAMINE (BENADRYL) 25 MG tablet Take 25 mg by mouth every 8 (eight) hours as needed. For allergies    [provider]  fexofenadine (ALLEGRA) 180 MG tablet Take 180 mg by mouth daily.    [provider]  fluticasone (FLONASE) 50 MCG/ACT nasal spray Place 1 spray into both nostrils daily.    [provider]  MAGNESIUM PO Take by mouth.    [provider]  ranitidine (ZANTAC) 150 MG tablet Take 150 mg by mouth daily as needed. For heartburn    [provider]  traMADol (ULTRAM) 50 MG tablet Take 50 mg by mouth every 6 (six) hours as needed.    [provider]      Allergies    Erythromycin    Review of Systems   Review of Systems  Constitutional:  Negative for chills and fever.  HENT:  Negative for facial swelling and trouble swallowing.   Eyes:  Negative for photophobia and visual disturbance.  Respiratory:  Negative for cough and shortness of breath.   Cardiovascular:  Positive for chest pain. Negative for  palpitations.  Gastrointestinal:  Negative for abdominal pain, nausea and vomiting.  Endocrine: Negative for polydipsia and polyuria.  Genitourinary:  Negative for difficulty urinating and hematuria.  Musculoskeletal:  Negative for gait problem and joint swelling.  Skin:  Negative for pallor and rash.  Neurological:  Negative for syncope and headaches.  Psychiatric/Behavioral:  Negative for agitation and confusion.    Physical Exam Updated Vital Signs BP (!) 125/94    Pulse (!) 56    Temp 98.3 F (36.8 C)    Resp (!) 24    Wt 45.4 kg    SpO2 100%    BMI 17.16 kg/m  Physical Exam Vitals and nursing note reviewed.  Constitutional:      General: She is not in acute distress.    Appearance: Normal appearance.  HENT:     Head: Normocephalic and atraumatic.     Right Ear: External ear normal.     Left Ear: External ear normal.     Nose: Nose normal.     Mouth/Throat:     Mouth: Mucous membranes are moist.  Eyes:     General: No scleral icterus.       Right eye: No discharge.        Left eye: No discharge.  Cardiovascular:     Rate and Rhythm: Normal rate and regular rhythm.     Pulses: Normal pulses.     Heart sounds: Normal heart sounds.  Pulmonary:     Effort: Pulmonary effort is normal. No respiratory distress.     Breath sounds: Normal breath sounds.  Chest:       Comments: No rash Abdominal:     General: Abdomen is flat. Bowel sounds are normal.     Palpations: Abdomen is soft.     Tenderness: There is no guarding or rebound.    Musculoskeletal:        General: Normal range of motion.     Cervical back: Normal range of motion.     Right lower leg: No edema.     Left lower leg: No edema.  Skin:    General: Skin is warm and dry.     Capillary Refill: Capillary refill takes less than 2 seconds.  Neurological:     Mental Status: She is alert and oriented to person, place, and time.     GCS: GCS eye subscore is 4. GCS verbal subscore is 5. GCS motor subscore is 6.   Psychiatric:        Mood and Affect: Mood normal.        Behavior: Behavior normal.    ED Results / Procedures / Treatments  Labs (all labs ordered are listed, but only abnormal results are displayed) Labs Reviewed  LIPASE, BLOOD  COMPREHENSIVE METABOLIC PANEL  CBC WITH DIFFERENTIAL/PLATELET  TROPONIN I (HIGH SENSITIVITY)  TROPONIN I (HIGH SENSITIVITY)    EKG EKG Interpretation  Date/Time:  Sunday June 06 2021 09:13:37 EST Ventricular Rate:  50 PR Interval:  146 QRS Duration: 78 QT Interval:  452 QTC Calculation: 413 R Axis:   83 Text Interpretation: Sinus rhythm Borderline right axis deviation Borderline low voltage, extremity leads No old tracing to compare No stemi Confirmed by Wynona Dove ([id number]) on 06/06/2021 9:43:16 AM  Radiology DG Chest 2 View  Result Date: 06/06/2021 CLINICAL DATA:  61 year old female with history of right-sided chest pain. EXAM: CHEST - 2 VIEW COMPARISON:  Chest x-ray 05/01/2018. FINDINGS: Lung volumes are normal. No consolidative airspace disease. No pleural effusions. No pneumothorax. No pulmonary nodule or mass noted. Pulmonary vasculature and the cardiomediastinal silhouette are within normal limits. IMPRESSION: No radiographic evidence of acute cardiopulmonary disease. Electronically Signed   By: Vinnie Langton M.D.   On: 06/06/2021 10:12    Procedures Procedures    Medications Ordered in ED Medications  lidocaine (LIDODERM) 5 % 1 patch (has no administration in time range)  famotidine (PEPCID) IVPB 20 mg premix (20 mg Intravenous New Bag/Given 06/06/21 0958)  alum & mag hydroxide-simeth (MAALOX/MYLANTA) 200-200-20 MG/5ML suspension 30 mL (30 mLs Oral Given 06/06/21 0955)    And  lidocaine (XYLOCAINE) 2 % viscous mouth solution 15 mL (15 mLs Oral Given 06/06/21 0955)  sodium chloride 0.9 % bolus 1,000 mL (1,000 mLs Intravenous New Bag/Given 06/06/21 0955)  ketorolac (TORADOL) 15 MG/ML injection 15 mg (15 mg Intravenous Given 06/06/21  1159)    ED Course/ Medical Decision Making/ A&P                           Medical Decision Making  CC: Right lower chest pain  This patient presents to the Emergency Department for the above complaint. This involves an extensive number of treatment options and is a complaint that carries with it a high risk of complications and morbidity. Vital signs were reviewed. Serious etiologies considered.  Record review:  Previous records obtained and reviewed   Medical and surgical history as noted above.   Work up as above, notable for:   Labs & imaging results that were available during my care of the patient were visualized by me and considered in my medical decision making.   I ordered imaging studies which included CXR and I visualized the imaging and I agree with radiologist interpretation. No acute process noted  Cardiac monitoring reviewed and interpreted personally which shows sinus bradycardia  Social determinants of health include - N/a  Management: GI cocktail, Toradol, Lidoderm patch  Reassessment:  Patient reports she is feeling better.  No dyspnea, no nausea or vomiting.  Tolerating p.o.  Ambulatory with steady gait   The patient's chest pain is not suggestive of pulmonary embolus, cardiac ischemia, aortic dissection, pericarditis, myocarditis, pulmonary embolism, pneumothorax, pneumonia, Zoster, or esophageal perforation, or other serious etiology.  Historically not abrupt in onset, tearing or ripping, pulses symmetric. EKG nonspecific for ischemia/infarction. No dysrhythmias, brugada, WPW, prolonged QT noted.   Well score is low  Troponin negative x1 (<2 with onset of pain >4 hours ago). CXR reviewed. Labs without demonstration of acute pathology unless otherwise noted above. Low HEAR score  Given the extremely low risk of these diagnoses further testing  and evaluation for these possibilities does not appear to be indicated at this time. Patient in no distress and  overall condition improved here in the ED. Detailed discussions were had with the patient regarding current findings, and need for close f/u with PCP or on call doctor. The patient has been instructed to return immediately if the symptoms worsen in any way for re-evaluation. Patient verbalized understanding and is in agreement with current care plan. All questions answered prior to discharge.        This chart was dictated using voice recognition software.  Despite best efforts to proofread,  errors can occur which can change the documentation meaning.    Amount and/or Complexity of Data Reviewed Labs: ordered. Radiology: ordered.  Risk OTC drugs. Prescription drug management.          Final Clinical Impression(s) / ED Diagnoses Final diagnoses:  Atypical chest pain    Rx / DC Orders ED Discharge Orders          Ordered    methocarbamol (ROBAXIN) 500 MG tablet  2 times daily        06/06/21 1159    lidocaine (LIDODERM) 5 %  Daily PRN        06/06/21 1159    ibuprofen (ADVIL) 600 MG tablet  Every 6 hours PRN        06/06/21 1159              Wynona Dove A, DO 06/06/21 1200

## 2021-06-06 NOTE — ED Notes (Signed)
Dc instructions reviewed with patient. Patient voiced understanding. Dc with belongings.  °

## 2021-06-06 NOTE — Discharge Instructions (Signed)

## 2021-06-06 NOTE — ED Triage Notes (Signed)
Right sharp chest / RUQ pain x 5 days . Hx pneumonia. Denies shortness of breath nor NV.

## 2021-10-01 ENCOUNTER — Encounter: Payer: Self-pay | Admitting: Family Medicine

## 2021-10-01 ENCOUNTER — Ambulatory Visit: Payer: No Typology Code available for payment source | Admitting: Family Medicine

## 2021-10-01 ENCOUNTER — Other Ambulatory Visit: Payer: Self-pay

## 2021-10-01 VITALS — BP 120/72 | HR 75 | Temp 98.2°F | Ht 64.0 in | Wt 105.4 lb

## 2021-10-01 DIAGNOSIS — E538 Deficiency of other specified B group vitamins: Secondary | ICD-10-CM | POA: Diagnosis not present

## 2021-10-01 DIAGNOSIS — Z1211 Encounter for screening for malignant neoplasm of colon: Secondary | ICD-10-CM | POA: Diagnosis not present

## 2021-10-01 DIAGNOSIS — R058 Other specified cough: Secondary | ICD-10-CM

## 2021-10-01 DIAGNOSIS — Z0000 Encounter for general adult medical examination without abnormal findings: Secondary | ICD-10-CM

## 2021-10-01 LAB — HEMOGLOBIN A1C: Hgb A1c MFr Bld: 5.5 % (ref 4.6–6.5)

## 2021-10-01 MED ORDER — ALBUTEROL SULFATE HFA 108 (90 BASE) MCG/ACT IN AERS: 2.0000 | INHALATION_SPRAY | Freq: Four times a day (QID) | RESPIRATORY_TRACT | 1 refills | Status: DC | PRN

## 2021-10-05 ENCOUNTER — Telehealth: Payer: Self-pay

## 2021-10-05 DIAGNOSIS — Z0000 Encounter for general adult medical examination without abnormal findings: Secondary | ICD-10-CM

## 2021-10-05 NOTE — Telephone Encounter (Signed)
LMOVM letting patient know she needs to be redraw. Labs ordered. Please schedule lab only appt

## 2021-10-14 ENCOUNTER — Other Ambulatory Visit (INDEPENDENT_AMBULATORY_CARE_PROVIDER_SITE_OTHER): Payer: No Typology Code available for payment source

## 2021-10-14 DIAGNOSIS — E538 Deficiency of other specified B group vitamins: Secondary | ICD-10-CM

## 2021-10-14 DIAGNOSIS — Z0000 Encounter for general adult medical examination without abnormal findings: Secondary | ICD-10-CM | POA: Diagnosis not present

## 2021-10-14 LAB — CBC WITH DIFFERENTIAL/PLATELET
Basophils Absolute: 0 10*3/uL (ref 0.0–0.1)
Basophils Relative: 0.8 % (ref 0.0–3.0)
Eosinophils Absolute: 0.2 10*3/uL (ref 0.0–0.7)
Eosinophils Relative: 3.4 % (ref 0.0–5.0)
HCT: 41.1 % (ref 36.0–46.0)
Hemoglobin: 13.6 g/dL (ref 12.0–15.0)
Lymphocytes Relative: 30.9 % (ref 12.0–46.0)
Lymphs Abs: 1.4 10*3/uL (ref 0.7–4.0)
MCHC: 33.1 g/dL (ref 30.0–36.0)
MCV: 91.2 fl (ref 78.0–100.0)
Monocytes Absolute: 0.5 10*3/uL (ref 0.1–1.0)
Monocytes Relative: 10.7 % (ref 3.0–12.0)
Neutro Abs: 2.5 10*3/uL (ref 1.4–7.7)
Neutrophils Relative %: 54.2 % (ref 43.0–77.0)
Platelets: 224 10*3/uL (ref 150.0–400.0)
RBC: 4.51 Mil/uL (ref 3.87–5.11)
RDW: 14.2 % (ref 11.5–15.5)
WBC: 4.7 10*3/uL (ref 4.0–10.5)

## 2021-10-14 LAB — LIPID PANEL
Cholesterol: 206 mg/dL — ABNORMAL HIGH (ref 0–200)
HDL: 103 mg/dL (ref >39.00)
LDL Cholesterol: 85 mg/dL (ref 0–99)
NonHDL: 103.2
Total CHOL/HDL Ratio: 2
Triglycerides: 90 mg/dL (ref 0.0–149.0)
VLDL: 18 mg/dL (ref 0.0–40.0)

## 2021-10-14 LAB — COMPREHENSIVE METABOLIC PANEL
ALT: 16 U/L (ref 0–35)
AST: 20 U/L (ref 0–37)
Albumin: 4.3 g/dL (ref 3.5–5.2)
Alkaline Phosphatase: 57 U/L (ref 39–117)
BUN: 13 mg/dL (ref 6–23)
CO2: 25 mEq/L (ref 19–32)
Calcium: 9.1 mg/dL (ref 8.4–10.5)
Chloride: 104 mEq/L (ref 96–112)
Creatinine, Ser: 0.81 mg/dL (ref 0.40–1.20)
GFR: 78.36 mL/min (ref >60.00)
Glucose, Bld: 99 mg/dL (ref 70–99)
Potassium: 3.9 mEq/L (ref 3.5–5.1)
Sodium: 137 mEq/L (ref 135–145)
Total Bilirubin: 0.7 mg/dL (ref 0.2–1.2)
Total Protein: 6.4 g/dL (ref 6.0–8.3)

## 2021-10-14 LAB — TSH: TSH: 1.28 u[IU]/mL (ref 0.35–5.50)

## 2021-10-14 LAB — VITAMIN B12: Vitamin B-12: >1504 pg/mL — ABNORMAL HIGH (ref 211–911)

## 2021-10-15 ENCOUNTER — Telehealth: Payer: Self-pay | Admitting: Family Medicine

## 2021-10-19 NOTE — Telephone Encounter (Signed)
Patient notified and verbalized understanding. 

## 2021-11-30 ENCOUNTER — Telehealth: Payer: Self-pay | Admitting: Family Medicine

## 2021-11-30 ENCOUNTER — Encounter (HOSPITAL_BASED_OUTPATIENT_CLINIC_OR_DEPARTMENT_OTHER): Payer: Self-pay | Admitting: Obstetrics and Gynecology

## 2021-11-30 ENCOUNTER — Emergency Department (HOSPITAL_BASED_OUTPATIENT_CLINIC_OR_DEPARTMENT_OTHER): Payer: No Typology Code available for payment source

## 2021-11-30 ENCOUNTER — Emergency Department (HOSPITAL_BASED_OUTPATIENT_CLINIC_OR_DEPARTMENT_OTHER)
Admission: EM | Admit: 2021-11-30 | Discharge: 2021-11-30 | Disposition: A | Discharge status: Home / Self Care | Payer: No Typology Code available for payment source | Attending: Emergency Medicine | Admitting: Emergency Medicine

## 2021-11-30 ENCOUNTER — Other Ambulatory Visit: Payer: Self-pay

## 2021-11-30 DIAGNOSIS — R29810 Facial weakness: Secondary | ICD-10-CM | POA: Insufficient documentation

## 2021-11-30 DIAGNOSIS — R200 Anesthesia of skin: Secondary | ICD-10-CM | POA: Insufficient documentation

## 2021-11-30 LAB — DIFFERENTIAL
Abs Immature Granulocytes: 0.02 10*3/uL (ref 0.00–0.07)
Basophils Absolute: 0 10*3/uL (ref 0.0–0.1)
Basophils Relative: 1 %
Eosinophils Absolute: 0.1 10*3/uL (ref 0.0–0.5)
Eosinophils Relative: 1 %
Immature Granulocytes: 0 %
Lymphocytes Relative: 21 %
Lymphs Abs: 1.7 10*3/uL (ref 0.7–4.0)
Monocytes Absolute: 0.6 10*3/uL (ref 0.1–1.0)
Monocytes Relative: 7 %
Neutro Abs: 5.5 10*3/uL (ref 1.7–7.7)
Neutrophils Relative %: 70 %

## 2021-11-30 LAB — PROTIME-INR
INR: 0.9 (ref 0.8–1.2)
Prothrombin Time: 12.3 seconds (ref 11.4–15.2)

## 2021-11-30 LAB — COMPREHENSIVE METABOLIC PANEL
ALT: 16 U/L (ref 0–44)
AST: 24 U/L (ref 15–41)
Albumin: 4.6 g/dL (ref 3.5–5.0)
Alkaline Phosphatase: 57 U/L (ref 38–126)
Anion gap: 12 (ref 5–15)
BUN: 8 mg/dL (ref 8–23)
CO2: 25 mmol/L (ref 22–32)
Calcium: 9.2 mg/dL (ref 8.9–10.3)
Chloride: 99 mmol/L (ref 98–111)
Creatinine, Ser: 0.89 mg/dL (ref 0.44–1.00)
GFR, Estimated: >60 mL/min (ref >60)
Glucose, Bld: 97 mg/dL (ref 70–99)
Potassium: 4.3 mmol/L (ref 3.5–5.1)
Sodium: 136 mmol/L (ref 135–145)
Total Bilirubin: 0.5 mg/dL (ref 0.3–1.2)
Total Protein: 7.2 g/dL (ref 6.5–8.1)

## 2021-11-30 LAB — CBC
HCT: 42.7 % (ref 36.0–46.0)
Hemoglobin: 14.6 g/dL (ref 12.0–15.0)
MCH: 30.3 pg (ref 26.0–34.0)
MCHC: 34.2 g/dL (ref 30.0–36.0)
MCV: 88.6 fL (ref 80.0–100.0)
Platelets: 247 10*3/uL (ref 150–400)
RBC: 4.82 MIL/uL (ref 3.87–5.11)
RDW: 13.9 % (ref 11.5–15.5)
WBC: 7.9 10*3/uL (ref 4.0–10.5)
nRBC: 0 % (ref 0.0–0.2)

## 2021-11-30 LAB — APTT: aPTT: 33 seconds (ref 24–36)

## 2021-11-30 LAB — ETHANOL: Alcohol, Ethyl (B): <10 mg/dL (ref <10)

## 2021-11-30 MED ORDER — SODIUM CHLORIDE 0.9% FLUSH
3.0000 mL | Freq: Once | INTRAVENOUS | Status: AC
  Administered 2021-11-30: 3 mL via INTRAVENOUS
  Filled 2021-11-30: qty 3

## 2021-11-30 NOTE — ED Triage Notes (Signed)
Patient reports at 7:50 am this morning she noticed her face felt numb and dry and then noticed it again later. Patient reports she was at work and was told she was drooling out of the right side of her face. Patient reports she has had no trouble walking, no visual changes, no change in arm movement or sensation.

## 2021-11-30 NOTE — Telephone Encounter (Signed)
Pt states: -Beginning around 7:50am on 11/30/21 -Right side of face has a feeling numbness under the eye then travels downward, does not go into the neck.  -pt is at work   Radiation protection practitioner with patient to not miss more work.   Referred to PCP Triage nurse, spoke with [place]. Nurse will be calling patient.   Awaiting PCP triage notes.

## 2021-11-30 NOTE — ED Provider Notes (Signed)
[facility] EMERGENCY DEPT Provider Note   CSN: [id number] Arrival date & time: 11/30/21  1327     History  Chief Complaint  Patient presents with   Numbness    Jasmin Bonilla is a 61 y.o. female.  HPI Patient presents with left-sided numbness and potentially weakness.  Began at 750 this morning.  Was at work and reportedly states her face felt dry.  States she was drooling.  Called PCP and was told to come in here.  No headache.  No confusion.  No other numbness or weakness.  Has not had episodes like this before.  No vision changes.   Past Medical History:  Diagnosis Date   Allergy    Anemia    Anxiety    hx. anxiety attacks   Degenerative disc disease, lumbar    GERD (gastroesophageal reflux disease)    occ. heartburn   Rash    on back - states may be from heating pad use   Tibia/fibula fracture    right tibial pilon and fib. fx.   I Home Medications Prior to Admission medications   Medication Sig Start Date End Date Taking? Authorizing Provider  albuterol (VENTOLIN HFA) 108 (90 Base) MCG/ACT inhaler Inhale 2 puffs into the lungs every 6 (six) hours as needed for wheezing or shortness of breath. 10/01/21   Tawnya Crook, MD  Ascorbic Acid (VITAMIN C PO) Take by mouth.    [provider]  baclofen (LIORESAL) 10 MG tablet Take half tablet at bedtime x 2 weeks, then increase to 1 tablet at bedtime Patient not taking: Reported on 10/01/2021 09/07/17   Narda Amber K, DO  Calcium Carb-Cholecalciferol (CALCIUM + D3 PO) Take by mouth.    [provider]  diphenhydrAMINE (BENADRYL) 25 MG tablet Take 25 mg by mouth every 8 (eight) hours as needed. For allergies    [provider]  esomeprazole (NEXIUM) 20 MG capsule Take 20 mg by mouth daily at 12 noon.    [provider]  fexofenadine (ALLEGRA) 180 MG tablet Take 180 mg by mouth daily.    [provider]  fluticasone (FLONASE) 50 MCG/ACT nasal spray Place 1 spray into  both nostrils daily.    [provider]  ibuprofen (ADVIL) 600 MG tablet Take 1 tablet (600 mg total) by mouth every 6 (six) hours as needed. 06/06/21   Wynona Dove A, DO  MAGNESIUM PO Take by mouth as needed.    [provider]  Multiple Vitamins-Minerals (ZINC PO) Take by mouth.    [provider]  traMADol (ULTRAM) 50 MG tablet Take 50 mg by mouth every 6 (six) hours as needed.    [provider]  VITAMIN D PO Take by mouth.    [provider]      Allergies    Morphine and Erythromycin    Review of Systems   Review of Systems  Physical Exam Updated Vital Signs BP (!) 149/111 (BP Location: Right Arm)   Pulse 66   Temp 98.3 F (36.8 C) (Oral)   Resp 15   Ht '5\' 4"'$  (1.626 m)   Wt 46.7 kg   SpO2 98%   BMI 17.68 kg/m  Physical Exam Vitals and nursing note reviewed.  HENT:     Head: Atraumatic.  Cardiovascular:     Rate and Rhythm: Regular rhythm.  Abdominal:     General: Abdomen is flat.     Tenderness: There is no abdominal tenderness.  Musculoskeletal:  General: No tenderness.     Cervical back: Neck supple.  Neurological:     Mental Status: She is alert.     Comments: Patient states slightly different sensation on right cheek.  Eye movements intact.  Visual fields intact.  Equal eyebrow raise.  Smile appears symmetric to me but patient's ex-husband states it may be slightly off but he is not sure.  Able to inflate both cheeks.  Eyelids close equally.     ED Results / Procedures / Treatments   Labs (all labs ordered are listed, but only abnormal results are displayed) Labs Reviewed  PROTIME-INR  APTT  CBC  DIFFERENTIAL  COMPREHENSIVE METABOLIC PANEL  ETHANOL  CBG MONITORING, ED    EKG None  Radiology CT HEAD WO CONTRAST  Result Date: 11/30/2021 CLINICAL DATA:  Neuro deficit, acute, stroke suspected EXAM: CT HEAD WITHOUT CONTRAST TECHNIQUE: Contiguous axial images were obtained from the base of the skull  through the vertex without intravenous contrast. RADIATION DOSE REDUCTION: This exam was performed according to the departmental dose-optimization program which includes automated exposure control, adjustment of the mA and/or kV according to patient size and/or use of iterative reconstruction technique. COMPARISON:  None Available. FINDINGS: Brain: No evidence of acute large vascular territory infarction, hemorrhage, hydrocephalus, extra-axial collection or mass lesion/mass effect. Vascular: No hyperdense vessel identified. Skull: No acute fracture. Sinuses/Orbits: Visualized sinuses are clear. No acute orbital findings. Other: No mastoid effusions. IMPRESSION: No evidence of acute intracranial abnormality. Electronically Signed   By: Margaretha Sheffield M.D.   On: 11/30/2021 13:58    Procedures Procedures    Medications Ordered in ED Medications  sodium chloride flush (NS) 0.9 % injection 3 mL (3 mLs Intravenous Given 11/30/21 1409)    ED Course/ Medical Decision Making/ A&P                           Medical Decision Making Amount and/or Complexity of Data Reviewed Labs: ordered. Radiology: ordered.   Patient with likely facial droop since patient was drooling and had paresthesias in right cheek.  For me my exam is normal except for subjective decrease sensation on the right cheek.  No dental tenderness.  Head CT done and reassuring.  Differential diagnosis includes cheek numbness from cause such as 5th cranial nerve irritation.  Bell's palsy also considered with difficulty with drooling previously.  TIA considered.  Unsure of forehead status with initial event.  Lab work reassuring.  Discussed with patient about risks for this being a TIA with no deficits at this time.  Her ABCD2 score is a 4 which would give her moderate risk.  Discussed admission to the hospital but patient refused.  Aware of risk and willing to accept it.  We will follow-up with neurology.  Will return for any new or recurrent  neurologic symptoms.  Has been given referrals to both neurology groups to see who can get her in quicker.  Advised on admission but patient refused        Final Clinical Impression(s) / ED Diagnoses Final diagnoses:  Facial droop  Facial numbness    Rx / DC Orders ED Discharge Orders          Ordered    Ambulatory referral to Neurology       Comments: An appointment is requested in approximately: 1 week   11/30/21 1505    Ambulatory referral to Neurology       Comments: An appointment is requested  in approximately: 1 week   11/30/21 1505              Davonna Belling, MD 11/30/21 [phone]

## 2021-11-30 NOTE — Discharge Instructions (Addendum)
Follow-up with neurology.  We cannot say for sure that this is not a TIA.  Return for any worsening symptoms.

## 2021-11-30 NOTE — Telephone Encounter (Signed)
Patient went to ED, notes in chart.

## 2021-11-30 NOTE — Telephone Encounter (Signed)
Patient Name: Jas[facility] Gender: Female DOB: [birth year] Age: 61 Y 55 M 11 D Return Phone Number: [phone] (Primary) Address: City/ State/ Zip: [place]  [address] Client [facility] Client [facility] [place] Contact Type Call Who Is Calling Patient / Member / Family / Caregiver Call Type Triage / Clinical Relationship To Patient Self Return Phone Number [phone] (Primary) Chief Complaint NUMBNESS/TINGLING- sudden on one side of the body or face Reason for Call Symptomatic / Request for Sharon states is from the front desk needing a nurse to call a patient.Caller states the patient told her that around 7:50am the right side of her face started feeling numb to the touch just below her eye and traveling down but not into her neck. Translation No Nurse Assessment Nurse: Humfleet, RN, Estill Bamberg Date/Time (Eastern Time): 11/30/2021 1:01:12 PM Confirm and document reason for call. If symptomatic, describe symptoms. ---caller states this morning the right side of her face became numb. she was also drooling. it is still numb. Does the patient have any new or worsening symptoms? ---Yes Will a triage be completed? ---Yes Related visit to physician within the last 2 weeks? ---No Does the PT have any chronic conditions? (i.e. diabetes, asthma, this includes High risk factors for pregnancy, etc.) ---No Is this a behavioral health or substance abuse call? ---No Guidelines Guideline Title Affirmed Question Affirmed Notes Nurse Date/Time (Eastern Time) Neurologic Deficit [1] Weakness (i.e., paralysis, loss of muscle strength) of the face, arm / hand, or leg / foot on one side of the body AND [2] sudden onset AND [3] present now (Exception: Bell's Humfleet, RN, Estill Bamberg 11/30/2021 1:00:42 PM  Guidelines Guideline Title Affirmed Question Affirmed Notes Nurse  Date/Time (Eastern Time) palsy suspected [i.e., weakness only on one side of the face, developing over hours to days, no other symptoms].) Disp. Time Eilene Ghazi Time) Disposition Final User 11/30/2021 12:59:41 PM Send to Urgent Charlie Pitter, Passion 11/30/2021 1:05:25 PM Call EMS 911 Now Yes Humfleet, RN, Estill Bamberg 11/30/2021 1:11:10 PM 911 Outcome Documentation Humfleet, RN, Estill Bamberg Reason: patient is driving to [place] Final Disposition 11/30/2021 1:05:25 PM Call EMS 911 Now Yes Humfleet, RN, Shelly Coss Disagree/Comply Comply Caller Understands Yes PreDisposition InappropriateToAsk Care Advice Given Per Guideline CALL EMS 911 NOW: * Immediate medical attention is needed. You need to hang up and call 911 (or an ambulance). CARE ADVICE given per Neurologic Deficit (Adult) guideline. Comments User: Rozelle Logan, RN Date/Time Eilene Ghazi Time): 11/30/2021 1:05:24 PM had patient ask coworker if her face was even when she smiles. coworker stated the right side was droopy.

## 2021-12-14 ENCOUNTER — Encounter: Payer: Self-pay | Admitting: Family Medicine

## 2021-12-14 ENCOUNTER — Ambulatory Visit (INDEPENDENT_AMBULATORY_CARE_PROVIDER_SITE_OTHER): Payer: No Typology Code available for payment source | Admitting: Family Medicine

## 2021-12-14 VITALS — BP 102/70 | HR 64 | Temp 98.1°F | Ht 64.0 in | Wt 106.0 lb

## 2021-12-14 DIAGNOSIS — G459 Transient cerebral ischemic attack, unspecified: Secondary | ICD-10-CM

## 2021-12-14 MED ORDER — LORAZEPAM 1 MG PO TABS: 1.0000 mg | ORAL_TABLET | Freq: Two times a day (BID) | ORAL | 0 refills | Status: DC | PRN

## 2021-12-14 NOTE — Progress Notes (Signed)
Subjective:     Patient ID: Jasmin Bonilla, female    DOB: 12/27/1960, 61 y.o.   MRN: [id number]  Chief Complaint  Patient presents with   Follow-up    ED follow-up on 11/30/21 for facial numbness and drooping     HPI Went to ED 8/22 for facial numbness and drooping-was on way to work and R face felt "dry" and then realized it was more of a numbness-then noticed drooling.  Worked all day then went to er. Had CT in ER.  Going to see neuro for f/u 9/21  Last wk, driving to work and got confused where she was at and felt shakey.  Resolved.  Yesterday felt dizzy and shaking R hand and "not feel good".  Anxious about work.  Today, better than yesterday, but still not good.   More stressed, will change jobs.   Bp's up some at times(like in ED).   Now. Feels fine.  ? Stress.  Occ ETOH.  No drugs  Health Maintenance Due  Topic Date Due   HIV Screening  Never done   PAP SMEAR-Modifier  Never done   COLONOSCOPY (Pts 45-38yr Insurance coverage will need to be confirmed)  Never done   MAMMOGRAM  05/22/2010    Past Medical History:  Diagnosis Date   Allergy    Anemia    Anxiety    hx. anxiety attacks   Degenerative disc disease, lumbar    GERD (gastroesophageal reflux disease)    occ. heartburn   Rash    on back - states may be from heating pad use   Tibia/fibula fracture    right tibial pilon and fib. fx.    Past Surgical History:  Procedure Laterality Date   DILATION AND CURETTAGE OF UTERUS  16 yrs. ago   molar pregnancy,  second one after chilbirth for bleeding/retained placenta   ORIF FIBULA FRACTURE  03/15/2011   Procedure: OPEN REDUCTION INTERNAL FIXATION (ORIF) FIBULA FRACTURE;  Surgeon: JWylene Simmer MD;  Location: [facility]  Service: Orthopedics;  Laterality: Right;  right fibula and tibia pilon fractures and possible heel cord lengthening    Outpatient Medications Prior to Visit  Medication Sig Dispense Refill   albuterol (VENTOLIN HFA) 108 (90 Base)  MCG/ACT inhaler Inhale 2 puffs into the lungs every 6 (six) hours as needed for wheezing or shortness of breath. 8 g 1   Ascorbic Acid (VITAMIN C PO) Take by mouth.     Azelastine HCl 137 MCG/SPRAY SOLN Place 2 sprays into both nostrils 2 (two) times daily.     Calcium Carb-Cholecalciferol (CALCIUM + D3 PO) Take by mouth.     diphenhydrAMINE (BENADRYL) 25 MG tablet Take 25 mg by mouth every 8 (eight) hours as needed. For allergies     esomeprazole (NEXIUM) 20 MG capsule Take 20 mg by mouth daily at 12 noon.     fexofenadine (ALLEGRA) 180 MG tablet Take 180 mg by mouth daily.     fluticasone (FLONASE) 50 MCG/ACT nasal spray Place 1 spray into both nostrils daily.     ibuprofen (ADVIL) 600 MG tablet Take 1 tablet (600 mg total) by mouth every 6 (six) hours as needed. 30 tablet 0   MAGNESIUM PO Take by mouth as needed.     Multiple Vitamins-Minerals (ZINC PO) Take by mouth.     traMADol (ULTRAM) 50 MG tablet Take 50 mg by mouth every 6 (six) hours as needed.     VITAMIN D PO Take by  mouth.     baclofen (LIORESAL) 10 MG tablet Take half tablet at bedtime x 2 weeks, then increase to 1 tablet at bedtime (Patient not taking: Reported on 10/01/2021) 30 each 5   No facility-administered medications prior to visit.    Allergies  Allergen Reactions   Erythromycin Other (See Comments)    GI upset Other reaction(s): Other GI upset   Morphine Hives   ROS neg/noncontributory except as noted HPI/below      Objective:     BP 102/70   Pulse 64   Temp 98.1 F (36.7 C) (Temporal)   Ht '5\' 4"'$  (1.626 m)   Wt 106 lb (48.1 kg)   SpO2 95%   BMI 18.19 kg/m  Wt Readings from Last 3 Encounters:  12/14/21 106 lb (48.1 kg)  11/30/21 103 lb (46.7 kg)  10/01/21 105 lb 6 oz (47.8 kg)    Physical Exam   Gen: WDWN NAD wf HEENT: NCAT, conjunctiva not injected, sclera nonicteric. EOMI,  fundi not well visualized NECK:  supple, no thyromegaly, no nodes, no carotid bruits CARDIAC: RRR, S1S2+, no murmur.  DP 2+B LUNGS: CTAB. No wheezes EXT:  no edema MSK: no gross abnormalities.  NEURO: A&O x3.  CN II-XII intact. F_N,RAM,pronator drift all neg. PSYCH: normal mood. Good eye contact  Reviewed Ed records     Assessment & Plan:   Problem List Items Addressed This Visit   None Visit Diagnoses     TIA (transient ischemic attack)    -  Primary   Relevant Orders   MR Brain Wo Contrast   US Carotid Duplex Bilateral     1  poss TIA vs anxiety vs other neurological disorder.  Take ASA '81mg'$  daily till see neuro.  Check MRI brain and carotid dopplers.  Off work this wk as may be triggering(back to old job next wk).   If recurs, other symptoms, go to ED.  Ativan '1mg'$  for MRI-pt aware needs driver.   Meds ordered this encounter  Medications   LORazepam (ATIVAN) 1 MG tablet    Sig: Take 1 tablet (1 mg total) by mouth 2 (two) times daily as needed for anxiety or sedation. Take 1 tab 1 hr prior procedure and may repeat in 52mn if needed    Dispense:  3 tablet    Refill:  0    AWellington Hampshire MD

## 2021-12-14 NOTE — Patient Instructions (Addendum)
Take aspirin '81mg'$  daily.  If recurs-ER  Scheduling MRI and carotid ultrasound

## 2021-12-23 ENCOUNTER — Ambulatory Visit
Admission: RE | Admit: 2021-12-23 | Discharge: 2021-12-23 | Disposition: A | Discharge status: Home / Self Care | Payer: No Typology Code available for payment source | Source: Ambulatory Visit | Attending: Family Medicine | Admitting: Family Medicine

## 2021-12-23 DIAGNOSIS — G459 Transient cerebral ischemic attack, unspecified: Secondary | ICD-10-CM

## 2021-12-30 ENCOUNTER — Ambulatory Visit: Payer: Self-pay | Admitting: Neurology

## 2021-12-30 ENCOUNTER — Ambulatory Visit
Admission: RE | Admit: 2021-12-30 | Discharge: 2021-12-30 | Disposition: A | Discharge status: Home / Self Care | Payer: No Typology Code available for payment source | Source: Ambulatory Visit | Attending: Family Medicine | Admitting: Family Medicine

## 2021-12-30 DIAGNOSIS — G459 Transient cerebral ischemic attack, unspecified: Secondary | ICD-10-CM

## 2022-01-11 ENCOUNTER — Ambulatory Visit (AMBULATORY_SURGERY_CENTER): Payer: Self-pay | Admitting: *Deleted

## 2022-01-11 VITALS — Ht 64.0 in | Wt 107.0 lb

## 2022-01-11 DIAGNOSIS — Z1211 Encounter for screening for malignant neoplasm of colon: Secondary | ICD-10-CM

## 2022-01-11 MED ORDER — NA SULFATE-K SULFATE-MG SULF 17.5-3.13-1.6 GM/177ML PO SOLN: 1.0000 | Freq: Once | ORAL | 0 refills | Status: AC

## 2022-01-11 NOTE — Progress Notes (Signed)
No egg or soy allergy known to patient  No issues known to pt with past sedation with any surgeries or procedures Patient denies ever being told they had issues or difficulty with intubation  No FH of Malignant Hyperthermia Pt is not on diet pills Pt is not on  home 02  Pt is not on blood thinners  Pt denies issues with constipation  No A fib or A flutter Have any cardiac testing pending--no Pt instructed to use Singlecare.com or GoodRx for a price reduction on prep   

## 2022-01-21 ENCOUNTER — Encounter: Payer: Self-pay | Admitting: Gastroenterology

## 2022-02-01 ENCOUNTER — Ambulatory Visit (AMBULATORY_SURGERY_CENTER): Payer: No Typology Code available for payment source | Admitting: Gastroenterology

## 2022-02-01 ENCOUNTER — Encounter: Payer: Self-pay | Admitting: Gastroenterology

## 2022-02-01 VITALS — BP 107/43 | HR 48 | Temp 96.6°F | Resp 24 | Ht 64.0 in | Wt 107.0 lb

## 2022-02-01 DIAGNOSIS — Z1211 Encounter for screening for malignant neoplasm of colon: Secondary | ICD-10-CM | POA: Diagnosis present

## 2022-02-01 DIAGNOSIS — D128 Benign neoplasm of rectum: Secondary | ICD-10-CM | POA: Diagnosis not present

## 2022-02-01 DIAGNOSIS — D123 Benign neoplasm of transverse colon: Secondary | ICD-10-CM | POA: Diagnosis not present

## 2022-02-01 DIAGNOSIS — D125 Benign neoplasm of sigmoid colon: Secondary | ICD-10-CM

## 2022-02-01 DIAGNOSIS — Z800 Family history of malignant neoplasm of digestive organs: Secondary | ICD-10-CM

## 2022-02-01 MED ORDER — SODIUM CHLORIDE 0.9 % IV SOLN: 500.0000 mL | Freq: Once | INTRAVENOUS | Status: DC

## 2022-02-01 NOTE — Progress Notes (Signed)
Pt's states no medical or surgical changes since previsit or office visit. 

## 2022-02-01 NOTE — Progress Notes (Signed)
Report to pacu rn. Vss. Care resumed by rn. 

## 2022-02-01 NOTE — Progress Notes (Signed)
Called to room to assist during endoscopic procedure.  Patient ID and intended procedure confirmed with present staff. Received instructions for my participation in the procedure from the performing physician.  

## 2022-02-01 NOTE — Progress Notes (Signed)
[facility] History and Physical   Primary Care Physician:  Tawnya Crook, MD   Reason for Procedure:  Colorectal cancer screening  Plan:    Screening colonoscopy with possible interventions as needed     HPI: Jasmin Bonilla is a very pleasant 61 y.o. female here for screening colonoscopy. Denies any nausea, vomiting, abdominal pain, melena or bright red blood per rectum  The risks and benefits as well as alternatives of endoscopic procedure(s) have been discussed and reviewed. All questions answered. The patient agrees to proceed.    Past Medical History:  Diagnosis Date   Allergy    Anemia    Anxiety    hx. anxiety attacks   Degenerative disc disease, lumbar    GERD (gastroesophageal reflux disease)    occ. heartburn   Osteoporosis    Rash    on back - states may be from heating pad use   Tibia/fibula fracture    right tibial pilon and fib. fx.    Past Surgical History:  Procedure Laterality Date   DILATION AND CURETTAGE OF UTERUS  1996   molar pregnancy,  second one after chilbirth for bleeding/retained placenta   ORIF FIBULA FRACTURE  03/15/2011   Procedure: OPEN REDUCTION INTERNAL FIXATION (ORIF) FIBULA FRACTURE;  Surgeon: Wylene Simmer, MD;  Location: [facility];  Service: Orthopedics;  Laterality: Right;  right fibula and tibia pilon fractures and possible heel cord lengthening    Prior to Admission medications   Medication Sig Start Date End Date Taking? Authorizing Provider  aspirin-acetaminophen-caffeine (EXCEDRIN MIGRAINE) [phone] MG tablet Take 1 tablet by mouth every 6 (six) hours as needed for headache.   Yes [provider]  esomeprazole (NEXIUM) 20 MG capsule Take 20 mg by mouth daily at 12 noon.   Yes [provider]  fexofenadine (ALLEGRA) 180 MG tablet Take 180 mg by mouth daily.   Yes [provider]  fluticasone (FLONASE) 50 MCG/ACT nasal spray Place 1 spray into both nostrils daily.   Yes  [provider]  melatonin 3 MG TABS tablet Take 3 mg by mouth at bedtime.   Yes [provider]  albuterol (VENTOLIN HFA) 108 (90 Base) MCG/ACT inhaler Inhale 2 puffs into the lungs every 6 (six) hours as needed for wheezing or shortness of breath. 10/01/21   Tawnya Crook, MD  Ascorbic Acid (VITAMIN C PO) Take by mouth. Patient not taking: Reported on 01/11/2022    [provider]  Azelastine HCl 137 MCG/SPRAY SOLN Place 2 sprays into both nostrils 2 (two) times daily. 11/10/21   [provider]  baclofen (LIORESAL) 10 MG tablet Take half tablet at bedtime x 2 weeks, then increase to 1 tablet at bedtime 09/07/17   Narda Amber K, DO  Calcium Carb-Cholecalciferol (CALCIUM + D3 PO) Take by mouth. Patient not taking: Reported on 01/11/2022    [provider]  diphenhydrAMINE (BENADRYL) 25 MG tablet Take 25 mg by mouth every 8 (eight) hours as needed. For allergies    [provider]  ibuprofen (ADVIL) 600 MG tablet Take 1 tablet (600 mg total) by mouth every 6 (six) hours as needed. 06/06/21   Jeanell Sparrow, DO  LORazepam (ATIVAN) 1 MG tablet Take 1 tablet (1 mg total) by mouth 2 (two) times daily as needed for anxiety or sedation. Take 1 tab 1 hr prior procedure and may repeat in 55mn if needed 12/14/21   KTawnya Crook MD  MAGNESIUM PO Take by mouth  as needed.    [provider]  Multiple Vitamins-Minerals (ZINC PO) Take by mouth. Patient not taking: Reported on 01/11/2022    [provider]  traMADol (ULTRAM) 50 MG tablet Take 50 mg by mouth every 6 (six) hours as needed.    [provider]  VITAMIN D PO Take by mouth. Patient not taking: Reported on 01/11/2022    [provider]    Current Outpatient Medications  Medication Sig Dispense Refill   aspirin-acetaminophen-caffeine (EXCEDRIN MIGRAINE) 250-250-65 MG tablet Take 1 tablet by mouth every 6 (six) hours as needed for headache.     esomeprazole  (NEXIUM) 20 MG capsule Take 20 mg by mouth daily at 12 noon.     fexofenadine (ALLEGRA) 180 MG tablet Take 180 mg by mouth daily.     fluticasone (FLONASE) 50 MCG/ACT nasal spray Place 1 spray into both nostrils daily.     melatonin 3 MG TABS tablet Take 3 mg by mouth at bedtime.     albuterol (VENTOLIN HFA) 108 (90 Base) MCG/ACT inhaler Inhale 2 puffs into the lungs every 6 (six) hours as needed for wheezing or shortness of breath. 8 g 1   Ascorbic Acid (VITAMIN C PO) Take by mouth. (Patient not taking: Reported on 01/11/2022)     Azelastine HCl 137 MCG/SPRAY SOLN Place 2 sprays into both nostrils 2 (two) times daily.     baclofen (LIORESAL) 10 MG tablet Take half tablet at bedtime x 2 weeks, then increase to 1 tablet at bedtime 30 each 5   Calcium Carb-Cholecalciferol (CALCIUM + D3 PO) Take by mouth. (Patient not taking: Reported on 01/11/2022)     diphenhydrAMINE (BENADRYL) 25 MG tablet Take 25 mg by mouth every 8 (eight) hours as needed. For allergies     ibuprofen (ADVIL) 600 MG tablet Take 1 tablet (600 mg total) by mouth every 6 (six) hours as needed. 30 tablet 0   LORazepam (ATIVAN) 1 MG tablet Take 1 tablet (1 mg total) by mouth 2 (two) times daily as needed for anxiety or sedation. Take 1 tab 1 hr prior procedure and may repeat in 16mn if needed 3 tablet 0   MAGNESIUM PO Take by mouth as needed.     Multiple Vitamins-Minerals (ZINC PO) Take by mouth. (Patient not taking: Reported on 01/11/2022)     traMADol (ULTRAM) 50 MG tablet Take 50 mg by mouth every 6 (six) hours as needed.     VITAMIN D PO Take by mouth. (Patient not taking: Reported on 01/11/2022)     Current Facility-Administered Medications  Medication Dose Route Frequency Provider Last Rate Last Admin   0.9 %  sodium chloride infusion  500 mL Intravenous Once NMauri Pole MD        Allergies as of 02/01/2022 - Review Complete 02/01/2022  Allergen Reaction Noted   Erythromycin Other (See Comments) 02/19/2011    Morphine Hives 11/19/2021    Family History  Problem Relation Age of Onset   Alzheimer's disease Mother    Colon cancer Father 740  Cervical cancer Sister    Diabetes Brother    Diabetes Maternal Grandmother    Heart attack Maternal Grandfather    Alzheimer's disease Paternal Grandmother    Stroke Paternal Grandfather    Stomach cancer Neg Hx    Esophageal cancer Neg Hx     Social History   Socioeconomic History   Marital status: Divorced    Spouse name: Not on file   Number of children:  1   Years of education: 39   Highest education level: Not on file  Occupational History   Not on file  Tobacco Use   Smoking status: Every Day    Packs/day: 0.25    Years: 39.00    Total pack years: 9.75    Types: Cigarettes    Last attempt to quit: 01/10/2016    Years since quitting: 6.0   Smokeless tobacco: Never  Vaping Use   Vaping Use: Never used  Substance and Sexual Activity   Alcohol use: Yes    Alcohol/week: 5.0 standard drinks of alcohol    Types: 5 Standard drinks or equivalent per week   Drug use: Never   Sexual activity: Yes  Other Topics Concern   Not on file  Social History Narrative   Lives with ex husband in a 2 story home.  Has one child.    Education: high school.    [employer] m[employer]   Social Determinants of Health   Financial Resource Strain: Not on file  Food Insecurity: Not on file  Transportation Needs: Not on file  Physical Activity: Not on file  Stress: Not on file  Social Connections: Not on file  Intimate Partner Violence: Not on file    Review of Systems:  All other review of systems negative except as mentioned in the HPI.  Physical Exam: Vital signs in last 24 hours: Blood Pressure 98/67   Pulse 72   Temperature (Abnormal) 96.6 F (35.9 C)   Respiration 11   Height '5\' 4"'$  (1.626 m)   Weight 107 lb (48.5 kg)   Oxygen Saturation 99%   Body Mass Index 18.37 kg/m  General:   Alert, NAD Lungs:  Clear .   Heart:  Regular rate  and rhythm Abdomen:  Soft, nontender and nondistended. Neuro/Psych:  Alert and cooperative. Normal mood and affect. A and O x 3  Reviewed labs, radiology imaging, old records and pertinent past GI work up  Patient is appropriate for planned procedure(s) and anesthesia in an ambulatory setting   K. Denzil Magnuson , MD [phone]

## 2022-02-01 NOTE — Patient Instructions (Signed)
HANDOUTS PROVIDED ON: POLYPS, DIVERTICULOSIS, & HEMORRHOIDS  The polyps removed today have been sent for pathology.  The results can take 1-3 weeks to receive.  When your next colonoscopy should occur will be based on the pathology results.    You may resume your previous diet and medication schedule.  Thank you for allowing us to care for you today!!!   YOU HAD AN ENDOSCOPIC PROCEDURE TODAY AT THE [facility]:   Refer to the procedure report that was given to you for any specific questions about what was found during the examination.  If the procedure report does not answer your questions, please call your gastroenterologist to clarify.  If you requested that your care partner not be given the details of your procedure findings, then the procedure report has been included in a sealed envelope for you to review at your convenience later.  YOU SHOULD EXPECT: Some feelings of bloating in the abdomen. Passage of more gas than usual.  Walking can help get rid of the air that was put into your GI tract during the procedure and reduce the bloating. If you had a lower endoscopy (such as a colonoscopy or flexible sigmoidoscopy) you may notice spotting of blood in your stool or on the toilet paper. If you underwent a bowel prep for your procedure, you may not have a normal bowel movement for a few days.  Please Note:  You might notice some irritation and congestion in your nose or some drainage.  This is from the oxygen used during your procedure.  There is no need for concern and it should clear up in a day or so.  SYMPTOMS TO REPORT IMMEDIATELY:  Following lower endoscopy (colonoscopy or flexible sigmoidoscopy):  Excessive amounts of blood in the stool  Significant tenderness or worsening of abdominal pains  Swelling of the abdomen that is new, acute  Fever of 100F or higher  For urgent or emergent issues, a gastroenterologist can be reached at any hour by calling [phone]. Do  not use MyChart messaging for urgent concerns.    DIET:  We do recommend a small meal at first, but then you may proceed to your regular diet.  Drink plenty of fluids but you should avoid alcoholic beverages for 24 hours.  ACTIVITY:  You should plan to take it easy for the rest of today and you should NOT DRIVE or use heavy machinery until tomorrow (because of the sedation medicines used during the test).    FOLLOW UP: Our staff will call the number listed on your records the next business day following your procedure.  We will call around 7:15- 8:00 am to check on you and address any questions or concerns that you may have regarding the information given to you following your procedure. If we do not reach you, we will leave a message.     If any biopsies were taken you will be contacted by phone or by letter within the next 1-3 weeks.  Please call us at [phone] if you have not heard about the biopsies in 3 weeks.    SIGNATURES/CONFIDENTIALITY: You and/or your care partner have signed paperwork which will be entered into your electronic medical record.  These signatures attest to the fact that that the information above on your After Visit Summary has been reviewed and is understood.  Full responsibility of the confidentiality of this discharge information lies with you and/or your care-partner.   

## 2022-02-01 NOTE — Op Note (Addendum)
[facility] Patient Name: Jasmin Bonilla Procedure Date: 02/01/2022 8:25 AM MRN: [id number] Endoscopist: Mauri Pole , MD Age: 61 Referring MD:  Date of Birth: 25-Dec-1960 Gender: Female Account #: [id number] Procedure:                Colonoscopy Indications:              Screening for colorectal malignant neoplasm,                            Screening in patient at increased risk: Colorectal                            cancer in father 36 or older Medicines:                Monitored Anesthesia Care Procedure:                Pre-Anesthesia Assessment:                           - Prior to the procedure, a History and Physical                            was performed, and patient medications and                            allergies were reviewed. The patient's tolerance of                            previous anesthesia was also reviewed. The risks                            and benefits of the procedure and the sedation                            options and risks were discussed with the patient.                            All questions were answered, and informed consent                            was obtained. Prior Anticoagulants: The patient has                            taken no previous anticoagulant or antiplatelet                            agents. ASA Grade Assessment: II - A patient with                            mild systemic disease. After reviewing the risks                            and benefits, the patient was deemed in  satisfactory condition to undergo the procedure.                           After obtaining informed consent, the colonoscope                            was passed under direct vision. Throughout the                            procedure, the patient's blood pressure, pulse, and                            oxygen saturations were monitored continuously. The                            Olympus PCF-H190DL ([id number])  Colonoscope was                            introduced through the anus and advanced to the the                            cecum, identified by appendiceal orifice and                            ileocecal valve. The colonoscopy was performed                            without difficulty. The patient tolerated the                            procedure well. The quality of the bowel                            preparation was good. The ileocecal valve,                            appendiceal orifice, and rectum were photographed. Scope In: 8:47:17 AM Scope Out: 9:03:03 AM Scope Withdrawal Time: 0 hours 12 minutes 18 seconds  Total Procedure Duration: 0 hours 15 minutes 46 seconds  Findings:                 The perianal and digital rectal examinations were                            normal.                           Three sessile polyps were found in the rectum,                            sigmoid colon and transverse colon. The polyps were                            4 to 7 mm in size. These polyps were removed with a  cold snare. Resection and retrieval were complete.                           A few small-mouthed diverticula were found in the                            sigmoid colon.                           Non-bleeding external and internal hemorrhoids were                            found during retroflexion. The hemorrhoids were                            medium-sized.                           The exam was otherwise without abnormality. Complications:            No immediate complications. Estimated Blood Loss:     Estimated blood loss was minimal. Impression:               - Three 4 to 7 mm polyps in the rectum, in the                            sigmoid colon and in the transverse colon, removed                            with a cold snare. Resected and retrieved.                           - Diverticulosis in the sigmoid colon.                           -  Non-bleeding external and internal hemorrhoids.                           - The examination was otherwise normal. Recommendation:           - Patient has a contact number available for                            emergencies. The signs and symptoms of potential                            delayed complications were discussed with the                            patient. Return to normal activities tomorrow.                            Written discharge instructions were provided to the                            patient.                           -  Resume previous diet.                           - Continue present medications.                           - Await pathology results.                           - Repeat colonoscopy in 3 - 5 years for                            surveillance based on pathology results. Mauri Pole, MD 02/01/2022 9:10:29 AM This report has been signed electronically.

## 2022-02-02 ENCOUNTER — Telehealth: Payer: Self-pay

## 2022-02-02 NOTE — Telephone Encounter (Signed)
  Follow up Call-     02/01/2022    7:50 AM  Call back number  Post procedure Call Back phone  # [phone]     Patient questions:  Do you have a fever, pain , or abdominal swelling? No. Pain Score  0 *  Have you tolerated food without any problems? Yes.    Have you been able to return to your normal activities? Yes.    Do you have any questions about your discharge instructions: Diet   No. Medications  No. Follow up visit  No.  Do you have questions or concerns about your Care? No.  Actions: * If pain score is 4 or above: No action needed, pain <4.

## 2022-02-13 ENCOUNTER — Encounter: Payer: Self-pay | Admitting: Gastroenterology

## 2022-05-09 ENCOUNTER — Encounter: Payer: Self-pay | Admitting: Family Medicine

## 2022-05-09 ENCOUNTER — Ambulatory Visit: Payer: No Typology Code available for payment source | Admitting: Family Medicine

## 2022-05-09 DIAGNOSIS — R058 Other specified cough: Secondary | ICD-10-CM

## 2022-05-09 LAB — POC COVID19 BINAXNOW: SARS Coronavirus 2 Ag: NEGATIVE

## 2022-05-09 MED ORDER — ALBUTEROL SULFATE HFA 108 (90 BASE) MCG/ACT IN AERS: 2.0000 | INHALATION_SPRAY | Freq: Four times a day (QID) | RESPIRATORY_TRACT | 1 refills | Status: AC | PRN

## 2022-05-09 MED ORDER — BENZONATATE 100 MG PO CAPS: 100.0000 mg | ORAL_CAPSULE | Freq: Three times a day (TID) | ORAL | 0 refills | Status: DC | PRN

## 2022-05-09 MED ORDER — AZITHROMYCIN 250 MG PO TABS: ORAL_TABLET | ORAL | 0 refills | Status: AC

## 2022-05-09 MED ORDER — PREDNISONE 20 MG PO TABS: 40.0000 mg | ORAL_TABLET | Freq: Every day | ORAL | 0 refills | Status: AC

## 2022-05-09 NOTE — Patient Instructions (Signed)
Albuterol inhaler every 6 hrs as needed  Zpk, prednisone, tessalon pereles for cough.  Worse or not improving, we'll do a chest x-ray.

## 2022-05-09 NOTE — Progress Notes (Signed)
Subjective:     Patient ID: Jasmin Bonilla, female    DOB: 1960-05-23, 62 y.o.   MRN: [id number]  Chief Complaint  Patient presents with   Follow-up    Pt states sx started around/before christmas    bronquitis    HPI Since before Christmas, cough for few days worse.  Intermitt since December.  Yellow/brown/green phlegm.  HA.  Slight sore throat.  Left work 2 days ago early.  Vomited x 2 that day only.  Cold and hot. Got really dizzy yesterday.  Only once this am. ?tactile temps. No SOB. Middle finger turned white the other day-toes do as well.   Gets bronchitis freq.  Running out of inhaler.    Health Maintenance Due  Topic Date Due   COVID-19 Vaccine (1) Never done   HIV Screening  Never done   DTaP/Tdap/Td (1 - Tdap) Never done   PAP SMEAR-Modifier  Never done   Zoster Vaccines- Shingrix (1 of 2) Never done    Past Medical History:  Diagnosis Date   Allergy    Anemia    Anxiety    hx. anxiety attacks   Degenerative disc disease, lumbar    GERD (gastroesophageal reflux disease)    occ. heartburn   Osteoporosis    Rash    on back - states may be from heating pad use   Tibia/fibula fracture    right tibial pilon and fib. fx.    Past Surgical History:  Procedure Laterality Date   DILATION AND CURETTAGE OF UTERUS  1996   molar pregnancy,  second one after chilbirth for bleeding/retained placenta   ORIF FIBULA FRACTURE  03/15/2011   Procedure: OPEN REDUCTION INTERNAL FIXATION (ORIF) FIBULA FRACTURE;  Surgeon: Wylene Simmer, MD;  Location: [facility];  Service: Orthopedics;  Laterality: Right;  right fibula and tibia pilon fractures and possible heel cord lengthening    Outpatient Medications Prior to Visit  Medication Sig Dispense Refill   aspirin-acetaminophen-caffeine (EXCEDRIN MIGRAINE) 250-250-65 MG tablet Take 1 tablet by mouth every 6 (six) hours as needed for headache.     diphenhydrAMINE (BENADRYL) 25 MG tablet Take 25 mg by mouth every 8  (eight) hours as needed. For allergies     esomeprazole (NEXIUM) 20 MG capsule Take 20 mg by mouth daily at 12 noon.     fexofenadine (ALLEGRA) 180 MG tablet Take 180 mg by mouth daily.     fluticasone (FLONASE) 50 MCG/ACT nasal spray Place 1 spray into both nostrils daily.     ibuprofen (ADVIL) 600 MG tablet Take 1 tablet (600 mg total) by mouth every 6 (six) hours as needed. 30 tablet 0   MAGNESIUM PO Take by mouth as needed.     melatonin 3 MG TABS tablet Take 3 mg by mouth at bedtime.     traMADol (ULTRAM) 50 MG tablet Take 50 mg by mouth every 6 (six) hours as needed.     albuterol (VENTOLIN HFA) 108 (90 Base) MCG/ACT inhaler Inhale 2 puffs into the lungs every 6 (six) hours as needed for wheezing or shortness of breath. 8 g 1   Ascorbic Acid (VITAMIN C PO) Take by mouth. (Patient not taking: Reported on 01/11/2022)     Azelastine HCl 137 MCG/SPRAY SOLN Place 2 sprays into both nostrils 2 (two) times daily. (Patient not taking: Reported on 05/09/2022)     baclofen (LIORESAL) 10 MG tablet Take half tablet at bedtime x 2 weeks, then increase to 1 tablet at  bedtime (Patient not taking: Reported on 05/09/2022) 30 each 5   Calcium Carb-Cholecalciferol (CALCIUM + D3 PO) Take by mouth. (Patient not taking: Reported on 01/11/2022)     LORazepam (ATIVAN) 1 MG tablet Take 1 tablet (1 mg total) by mouth 2 (two) times daily as needed for anxiety or sedation. Take 1 tab 1 hr prior procedure and may repeat in 15mn if needed (Patient not taking: Reported on 05/09/2022) 3 tablet 0   Multiple Vitamins-Minerals (ZINC PO) Take by mouth. (Patient not taking: Reported on 01/11/2022)     VITAMIN D PO Take by mouth. (Patient not taking: Reported on 01/11/2022)     No facility-administered medications prior to visit.    Allergies  Allergen Reactions   Erythromycin Other (See Comments)    GI upset Other reaction(s): Other GI upset   Morphine Hives   ROS neg/noncontributory except as noted HPI/below       Objective:     BP 93/64 (BP Location: Right Arm, Patient Position: Sitting)   Pulse 92   Temp 97.8 F (36.6 C) (Temporal)   Ht '5\' 4"'$  (1.626 m)   Wt 105 lb 12.8 oz (48 kg)   SpO2 96%   BMI 18.16 kg/m  Wt Readings from Last 3 Encounters:  05/09/22 105 lb 12.8 oz (48 kg)  02/01/22 107 lb (48.5 kg)  01/11/22 107 lb (48.5 kg)    Physical Exam   Gen: WDWN NAD HEENT: NCAT, conjunctiva not injected, sclera nonicteric TM WNL B, OP moist, no exudates  NECK:  supple, no thyromegaly, no nodes, no carotid bruits CARDIAC: RRR, S1S2+, no murmur.  LUNGS: CTAB. No wheezes EXT:  no edema MSK: no gross abnormalities.  NEURO: A&O x3.  CN II-XII intact.  PSYCH: normal mood. Good eye contact Results for orders placed or performed in visit on 05/09/22  POC COVID-19  Result Value Ref Range   SARS Coronavirus 2 Ag Negative Negative        Assessment & Plan:   Problem List Items Addressed This Visit       Other   Cough   Relevant Medications   albuterol (VENTOLIN HFA) 108 (90 Base) MCG/ACT inhaler   Cough/bronchitis-albuterol, zpk, pred '40mg'$  daily, tessalon perles '100mg'$  tid. Off work till 2/1 F/u 2 wks.  Stop smoking 2.  Finger-?raunaud's.  Stop smoking.  Keep warm.  Meds ordered this encounter  Medications   albuterol (VENTOLIN HFA) 108 (90 Base) MCG/ACT inhaler    Sig: Inhale 2 puffs into the lungs every 6 (six) hours as needed for wheezing or shortness of breath.    Dispense:  8 g    Refill:  1   azithromycin (ZITHROMAX) 250 MG tablet    Sig: Take 2 tablets on day 1, then 1 tablet daily on days 2 through 5    Dispense:  6 tablet    Refill:  0   predniSONE (DELTASONE) 20 MG tablet    Sig: Take 2 tablets (40 mg total) by mouth daily with breakfast for 5 days.    Dispense:  10 tablet    Refill:  0   benzonatate (TESSALON PERLES) 100 MG capsule    Sig: Take 1 capsule (100 mg total) by mouth 3 (three) times daily as needed.    Dispense:  20 capsule    Refill:  0    AWellington Hampshire MD

## 2022-05-11 ENCOUNTER — Telehealth: Payer: Self-pay | Admitting: Family Medicine

## 2022-05-11 ENCOUNTER — Other Ambulatory Visit: Payer: Self-pay | Admitting: Family Medicine

## 2022-05-11 MED ORDER — HYDROCOD POLI-CHLORPHE POLI ER 10-8 MG/5ML PO SUER: 5.0000 mL | Freq: Two times a day (BID) | ORAL | 0 refills | Status: DC | PRN

## 2022-05-11 NOTE — Telephone Encounter (Signed)
Patient states Tessalon Pearls are not working. States cough has stayed the same and is keeping her up all night.  Patient requests a new RX for a cough medication (cough syrup would be fine) [facility]  [id number] [phone] Fax: [phone]    Patient requests to be called when new RX is sent to Pharmacy.

## 2022-05-11 NOTE — Telephone Encounter (Signed)
Pt said thank you so much and that she understands not to take tramadol with cough syrup

## 2022-05-13 LAB — HM MAMMOGRAPHY

## 2022-05-20 ENCOUNTER — Telehealth: Payer: Self-pay | Admitting: Family Medicine

## 2022-05-20 ENCOUNTER — Other Ambulatory Visit: Payer: Self-pay | Admitting: *Deleted

## 2022-05-20 MED ORDER — CEFDINIR 300 MG PO CAPS: 300.0000 mg | ORAL_CAPSULE | Freq: Two times a day (BID) | ORAL | 0 refills | Status: AC

## 2022-05-20 NOTE — Telephone Encounter (Signed)
Pt states she is not getting better and states she usually needs 2 rounds of antibiotics to heal her bronchitis, please advise.

## 2022-05-20 NOTE — Telephone Encounter (Signed)
Please see message and advise 

## 2022-05-20 NOTE — Telephone Encounter (Signed)
Rx sent to the pharmacy. Patient notified and verbalized understanding.

## 2022-05-24 LAB — HM MAMMOGRAPHY

## 2022-05-27 ENCOUNTER — Encounter: Payer: Self-pay | Admitting: Family Medicine

## 2022-05-27 ENCOUNTER — Ambulatory Visit: Payer: No Typology Code available for payment source | Admitting: Family Medicine

## 2022-05-27 VITALS — BP 100/64 | HR 81 | Temp 98.1°F | Ht 64.0 in | Wt 105.0 lb

## 2022-05-27 DIAGNOSIS — J40 Bronchitis, not specified as acute or chronic: Secondary | ICD-10-CM | POA: Diagnosis not present

## 2022-05-27 DIAGNOSIS — I7300 Raynaud's syndrome without gangrene: Secondary | ICD-10-CM

## 2022-05-27 NOTE — Progress Notes (Signed)
Subjective:     Patient ID: XICLALY ELSAESSER, female    DOB: 1960-06-03, 62 y.o.   MRN: [id number]  Chief Complaint  Patient presents with   Follow-up    2 week follow-up for bronchitis Feeling better, still has a cough Took Alka seltzer Plus last night    HPI Bronchitis-feeling a lot better.  Still some cough. No sob.  Not using inhaler.   Took alka-seltzer yesterday.  Stopped smoking since sick  When cold toes hurt on R.  No more issue w/fingers.  Health Maintenance Due  Topic Date Due   HIV Screening  Never done   DTaP/Tdap/Td (1 - Tdap) Never done   PAP SMEAR-Modifier  Never done    Past Medical History:  Diagnosis Date   Allergy    Anemia    Anxiety    hx. anxiety attacks   Degenerative disc disease, lumbar    GERD (gastroesophageal reflux disease)    occ. heartburn   Osteoporosis    Rash    on back - states may be from heating pad use   Tibia/fibula fracture    right tibial pilon and fib. fx.    Past Surgical History:  Procedure Laterality Date   DILATION AND CURETTAGE OF UTERUS  1996   molar pregnancy,  second one after chilbirth for bleeding/retained placenta   ORIF FIBULA FRACTURE  03/15/2011   Procedure: OPEN REDUCTION INTERNAL FIXATION (ORIF) FIBULA FRACTURE;  Surgeon: Wylene Simmer, MD;  Location: [facility];  Service: Orthopedics;  Laterality: Right;  right fibula and tibia pilon fractures and possible heel cord lengthening    Outpatient Medications Prior to Visit  Medication Sig Dispense Refill   albuterol (VENTOLIN HFA) 108 (90 Base) MCG/ACT inhaler Inhale 2 puffs into the lungs every 6 (six) hours as needed for wheezing or shortness of breath. 8 g 1   aspirin-acetaminophen-caffeine (EXCEDRIN MIGRAINE) 250-250-65 MG tablet Take 1 tablet by mouth every 6 (six) hours as needed for headache.     benzonatate (TESSALON PERLES) 100 MG capsule Take 1 capsule (100 mg total) by mouth 3 (three) times daily as needed. 20 capsule 0    chlorpheniramine-HYDROcodone (TUSSIONEX) 10-8 MG/5ML Take 5 mLs by mouth every 12 (twelve) hours as needed for cough. 115 mL 0   diphenhydrAMINE (BENADRYL) 25 MG tablet Take 25 mg by mouth every 8 (eight) hours as needed. For allergies     esomeprazole (NEXIUM) 20 MG capsule Take 20 mg by mouth daily at 12 noon.     fexofenadine (ALLEGRA) 180 MG tablet Take 180 mg by mouth daily.     fluticasone (FLONASE) 50 MCG/ACT nasal spray Place 1 spray into both nostrils daily.     ibuprofen (ADVIL) 600 MG tablet Take 1 tablet (600 mg total) by mouth every 6 (six) hours as needed. 30 tablet 0   MAGNESIUM PO Take by mouth as needed.     melatonin 3 MG TABS tablet Take 3 mg by mouth at bedtime.     traMADol (ULTRAM) 50 MG tablet Take 50 mg by mouth every 6 (six) hours as needed.     No facility-administered medications prior to visit.    Allergies  Allergen Reactions   Erythromycin Other (See Comments)    GI upset Other reaction(s): Other GI upset   Morphine Hives   ROS neg/noncontributory except as noted HPI/below      Objective:     BP 100/64   Pulse 81   Temp 98.1 F (36.7  C) (Temporal)   Ht 5' 4"$  (1.626 m)   Wt 105 lb (47.6 kg)   SpO2 97%   BMI 18.02 kg/m  Wt Readings from Last 3 Encounters:  05/27/22 105 lb (47.6 kg)  05/09/22 105 lb 12.8 oz (48 kg)  02/01/22 107 lb (48.5 kg)    Physical Exam   Gen: WDWN NAD HEENT: NCAT, conjunctiva not injected, sclera nonicteric NECK:  supple, no thyromegaly, no nodes, no carotid bruits CARDIAC: RRR, S1S2+, no murmur. DP 2+B cap refill <3s toes/fingers.  LUNGS: CTAB. No wheezes EXT:  no edema MSK: no gross abnormalities.  NEURO: A&O x3.  CN II-XII intact.  PSYCH: normal mood. Good eye contact     Assessment & Plan:   Problem List Items Addressed This Visit   None Visit Diagnoses     Bronchitis    -  Primary   Raynaud's phenomenon without gangrene          Bronchitis-resolving.  Has quit smoking since dx-stay off.  If cough  continues 2-3 more wks, check cxr.   Poss Raynaud's.  Congrats on smoking cessation-stay off.  Keep feet/hands warm.  Worse, etc, consider amlodipine F/u as sch in June  No orders of the defined types were placed in this encounter.   Wellington Hampshire, MD

## 2022-05-27 NOTE — Patient Instructions (Addendum)
It was very nice to see you today!  Stay off smoking.  Keep hands and feet warm.   If cough continues in 2-3 wks, we'll order x-ray   PLEASE NOTE:  If you had any lab tests please let us know if you have not heard back within a few days. You may see your results on MyChart before we have a chance to review them but we will give you a call once they are reviewed by [place]. If we ordered any referrals today, please let us know if you have not heard from their office within the next week.   Please try these tips to maintain a healthy lifestyle:  Eat most of your calories during the day when you are active. Eliminate processed foods including packaged sweets (pies, cakes, cookies), reduce intake of potatoes, white bread, white pasta, and white rice. Look for whole grain options, oat flour or almond flour.  Each meal should contain half fruits/vegetables, one quarter protein, and one quarter carbs (no bigger than a computer mouse).  Cut down on sweet beverages. This includes juice, soda, and sweet tea. Also watch fruit intake, though this is a healthier sweet option, it still contains natural sugar! Limit to 3 servings daily.  Drink at least 1 glass of water with each meal and aim for at least 8 glasses per day  Exercise at least 150 minutes every week.

## 2022-07-11 ENCOUNTER — Telehealth: Payer: Self-pay | Admitting: Family Medicine

## 2022-07-11 NOTE — Telephone Encounter (Signed)
Advised to see provider within 24 hrs. Will call to schedule ov.  Patient Name: Jasmin Bonilla [facility] Gender: Female DOB: 12/05/1960 Age: 62 Y 1 M 18 D Return Phone Number: [phone] (Primary) Address: City/ State/ Zip: [place]  [address] Client [facility] Client [facility] [place] [facility] Provider Esther Hardy Contact Type Call Who Is Calling Patient / Member / Family / Caregiver Call Type Triage / Clinical Relationship To Patient Self Return Phone Number [phone] (Primary) Chief Complaint Tailbone Injury Reason for Call Symptomatic / Request for Bovina states she fell on the stairs and is having pain in her tailbone. Translation No Nurse Assessment Nurse: Lissa Merlin, RN, Abigail Date/Time (Eastern Time): 07/08/2022 10:21:11 AM Confirm and document reason for call. If symptomatic, describe symptoms. ---Caller states that she fell on the stairs and landed on her tailbone 11 days ago. THen tripped on 3/23 and had pain as well. Pain is currenlty 8/10 in the "middle of her crack or maybe a little above it" able to sit down but it hurts. Helps when laying on the heating pad. problems lifting. Bending and lifting is the worst pain. Does the patient have any new or worsening symptoms? ---Yes Will a triage be completed? ---Yes Related visit to physician within the last 2 weeks? ---No Does the PT have any chronic conditions? (i.e. diabetes, asthma, this includes High risk factors for pregnancy, etc.) ---No Is this a behavioral health or substance abuse call? ---No Guidelines Guideline Title Affirmed Question Affirmed Notes Nurse Date/Time (Eastern Time) Tailbone Injury [1] MODERATE pain (e.g., interferes with normal activities) AND [2] high-risk adult (e.g., age > 48 years, Tyler Pita 07/08/2022 10:25:08 AM Guidelines Guideline Title Affirmed Question Affirmed Notes  Nurse Date/Time (Eastern Time) osteoporosis, chronic steroid use) Disp. Time Eilene Ghazi Time) Disposition Final User 07/08/2022 10:30:20 AM See PCP within 24 Hours Yes Lissa Merlin RN, Vernie Shanks Final Disposition 07/08/2022 10:30:20 AM See PCP within 24 Hours Yes Lissa Merlin, RN, Vernie Shanks Caller Disagree/Comply Disagree Caller Understands Yes PreDisposition Call Doctor Care Advice Given Per Guideline SEE PCP WITHIN 24 HOURS: * IF OFFICE WILL BE CLOSED: You need to be seen within the next 24 hours. A clinic or an urgent care center is often a good source of care if your doctor's office is closed or you can't get an appointment. * NAPROXEN (E.G., ALEVE): Take 220 mg (one 220 mg pill) by mouth every 8 to 12 hours as needed. You may take 440 mg (two 220 mg pills) for your first dose. The most you should take is 3 pills a day (660 mg total). Note: In [place], the maximum is 2 pills a day (one every 12 hours; 440 mg total). * IBUPROFEN (E.G., MOTRIN, ADVIL): Take 400 mg (two 200 mg pills) by mouth every 6 hours. The most you should take is 6 pills a day (1,200 mg total). CARE ADVICE given per Tailbone Injury (Adult) guideline. * ACETAMINOPHEN - EXTRA STRENGTH TYLENOL: Take 1,000 mg (two 500 mg pills) every 6 to 8 hours as needed. Each Extra Strength Tylenol pill has 500 mg of acetaminophen. The most you should take is 6 pills a day (3,000 mg total). Note: In [place], the maximum is 8 pills a day (4,000 mg total). CALL BACK IF: * You become worse Referrals GO TO FACILITY UNDECIDED

## 2022-07-11 NOTE — Telephone Encounter (Signed)
Noted. Thanks.

## 2022-07-26 ENCOUNTER — Ambulatory Visit: Payer: No Typology Code available for payment source | Admitting: Family Medicine

## 2022-08-18 ENCOUNTER — Encounter: Payer: Self-pay | Admitting: Family Medicine

## 2022-08-18 ENCOUNTER — Ambulatory Visit: Payer: No Typology Code available for payment source | Admitting: Family Medicine

## 2022-08-18 VITALS — BP 120/78 | HR 79 | Temp 98.2°F | Resp 18 | Ht 64.0 in | Wt 113.1 lb

## 2022-08-18 DIAGNOSIS — E2839 Other primary ovarian failure: Secondary | ICD-10-CM

## 2022-08-18 DIAGNOSIS — S32020D Wedge compression fracture of second lumbar vertebra, subsequent encounter for fracture with routine healing: Secondary | ICD-10-CM

## 2022-08-18 NOTE — Patient Instructions (Addendum)
Calcium D 600/400 twice daily

## 2022-08-18 NOTE — Progress Notes (Signed)
Subjective:     Patient ID: ASHVI PLAISANCE, female    DOB: Aug 20, 1960, 62 y.o.   MRN: [id number]  Chief Complaint  Patient presents with   Bone Density    Discuss the need for a bone density test     HPI On 06/27/22-fell on steps(was running to get cat)    has hardwood stairs and was wearing socks.  Fell onto butt/back.  Went to urgent care.  Did x-rays.  Told fracture L2 and L3.  Got appointment w/orthopedic on 4/12.  Did more x-rays.  Saw again on 5/3 and saw Dr Shelle Iron.  Healing. Told to get DXA from PCP.  Wearing brace.  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  Taking tramadol.  Getting sharp pain and  lower down as well.  Just started calcium 500 w D twice daily.   Health Maintenance Due  Topic Date Due   HIV Screening  Never done   DTaP/Tdap/Td (1 - Tdap) Never done   PAP SMEAR-Modifier  Never done    Past Medical History:  Diagnosis Date   Allergy    Anemia    Anxiety    hx. anxiety attacks   Degenerative disc disease, lumbar    GERD (gastroesophageal reflux disease)    occ. heartburn   Osteoporosis    Rash    on back - states may be from heating pad use   Tibia/fibula fracture    right tibial pilon and fib. fx.    Past Surgical History:  Procedure Laterality Date   DILATION AND CURETTAGE OF UTERUS  1996   molar pregnancy,  second one after chilbirth for bleeding/retained placenta   ORIF FIBULA FRACTURE  03/15/2011   Procedure: OPEN REDUCTION INTERNAL FIXATION (ORIF) FIBULA FRACTURE;  Surgeon: [name], MD;  Location: [facility];  Service: Orthopedics;  Laterality: Right;  right fibula and tibia pilon fractures and possible heel cord lengthening     Current Outpatient Medications:    albuterol (VENTOLIN HFA) 108 (90 Base) MCG/ACT inhaler, Inhale 2 puffs into the lungs every 6 (six) hours as needed for wheezing or shortness of breath., Disp: 8 g, Rfl: 1   aspirin-acetaminophen-caffeine (EXCEDRIN MIGRAINE) 250-250-65 MG  tablet, Take 1 tablet by mouth every 6 (six) hours as needed for headache., Disp: , Rfl:    CALCIUM PO, Take by mouth., Disp: , Rfl:    Cholecalciferol (D3 PO), Take by mouth., Disp: , Rfl:    diphenhydrAMINE (BENADRYL) 25 MG tablet, Take 25 mg by mouth every 8 (eight) hours as needed. For allergies, Disp: , Rfl:    esomeprazole (NEXIUM) 20 MG capsule, Take 20 mg by mouth daily at 12 noon., Disp: , Rfl:    fexofenadine (ALLEGRA) 180 MG tablet, Take 180 mg by mouth daily., Disp: , Rfl:    fluticasone (FLONASE) 50 MCG/ACT nasal spray, Place 1 spray into both nostrils daily., Disp: , Rfl:    ibuprofen (ADVIL) 600 MG tablet, Take 1 tablet (600 mg total) by mouth every 6 (six) hours as needed., Disp: 30 tablet, Rfl: 0   MAGNESIUM PO, Take by mouth as needed., Disp: , Rfl:    melatonin 3 MG TABS tablet, Take 3 mg by mouth at bedtime., Disp: , Rfl:    traMADol (ULTRAM) 50 MG tablet, Take 50 mg by mouth every 6 (six) hours as needed., Disp: , Rfl:   Allergies  Allergen Reactions   Erythromycin Other (See Comments)    GI upset Other reaction(s): Other  GI upset   Morphine Hives and Other (See Comments)   ROS neg/noncontributory except as noted HPI/below Gets heartburn frequently.        Objective:     BP 120/78   Pulse 79   Temp 98.2 F (36.8 C) (Temporal)   Resp 18   Ht 5\' 4"  (1.626 m)   Wt 113 lb 2 oz (51.3 kg)   SpO2 97%   BMI 19.42 kg/m  Wt Readings from Last 3 Encounters:  08/18/22 113 lb 2 oz (51.3 kg)  05/27/22 105 lb (47.6 kg)  05/09/22 105 lb 12.8 oz (48 kg)    Physical Exam   Gen: WDWN NAD HEENT: NCAT, conjunctiva not injected, sclera nonicteric EXT:  no edema MSK: wearing back brace.  NEURO: A&O x3.  CN II-XII intact.  PSYCH: normal mood. Good eye contact  Reviewed orthopedic note     Assessment & Plan:  Estrogen deficiency -     DG Bone Density; Future  Compression fracture of L2 vertebra with routine healing, subsequent encounter -     DG Bone Density;  Future  Compression Fracture L2/L3-care per orthopedic.  Will check DXA  take calcium/D 600/400 twice daily. Will discuss treatment(s) when avail   Angelena Sole, MD

## 2022-08-22 ENCOUNTER — Ambulatory Visit (HOSPITAL_BASED_OUTPATIENT_CLINIC_OR_DEPARTMENT_OTHER)
Admission: RE | Admit: 2022-08-22 | Discharge: 2022-08-22 | Disposition: A | Discharge status: Home / Self Care | Payer: No Typology Code available for payment source | Source: Ambulatory Visit | Attending: Family Medicine | Admitting: Family Medicine

## 2022-08-22 DIAGNOSIS — S32020D Wedge compression fracture of second lumbar vertebra, subsequent encounter for fracture with routine healing: Secondary | ICD-10-CM | POA: Diagnosis present

## 2022-08-22 DIAGNOSIS — E2839 Other primary ovarian failure: Secondary | ICD-10-CM | POA: Insufficient documentation

## 2022-08-29 ENCOUNTER — Ambulatory Visit: Payer: No Typology Code available for payment source | Admitting: Family Medicine

## 2022-09-20 ENCOUNTER — Encounter: Payer: Self-pay | Admitting: Family Medicine

## 2022-09-20 ENCOUNTER — Ambulatory Visit: Payer: No Typology Code available for payment source | Admitting: Family Medicine

## 2022-09-20 VITALS — BP 90/60 | HR 69 | Temp 98.3°F | Resp 18 | Ht 64.0 in | Wt 115.2 lb

## 2022-09-20 DIAGNOSIS — M8000XA Age-related osteoporosis with current pathological fracture, unspecified site, initial encounter for fracture: Secondary | ICD-10-CM | POA: Insufficient documentation

## 2022-09-20 MED ORDER — ALENDRONATE SODIUM 70 MG PO TABS: 70.0000 mg | ORAL_TABLET | ORAL | 3 refills | Status: DC

## 2022-09-20 NOTE — Patient Instructions (Signed)

## 2022-09-20 NOTE — Progress Notes (Signed)
Subjective:     Patient ID: Jasmin Bonilla, female    DOB: Dec 14, 1960, 62 y.o.   MRN: [id number]  Chief Complaint  Patient presents with   Results    Discuss bone density results     HPI Osteoporosis w/recent fracture L2 and L3.  Had recent DXA--2.7 right total femur.  -0.6 L1-2, left forearm -0.8.   taking calcium and vitamin D.  Walks 4-5x/week(s). Has GERD(taking OTC (available over the counter without a prescription) medications).  Occasional bad w/wrong foods. May be without insurance  2.  Fracture L2 and L3-doing physical therapy.  Wearing brace 1/2 day now.  Will return to work 7/4 and wear then  Health Maintenance Due  Topic Date Due   HIV Screening  Never done   DTaP/Tdap/Td (1 - Tdap) Never done   PAP SMEAR-Modifier  Never done    Past Medical History:  Diagnosis Date   Allergy    Anemia    Anxiety    hx. anxiety attacks   Degenerative disc disease, lumbar    GERD (gastroesophageal reflux disease)    occ. heartburn   Osteoporosis    Rash    on back - states may be from heating pad use   Tibia/fibula fracture    right tibial pilon and fib. fx.    Past Surgical History:  Procedure Laterality Date   DILATION AND CURETTAGE OF UTERUS  1996   molar pregnancy,  second one after chilbirth for bleeding/retained placenta   ORIF FIBULA FRACTURE  03/15/2011   Procedure: OPEN REDUCTION INTERNAL FIXATION (ORIF) FIBULA FRACTURE;  Surgeon: [name], MD;  Location: [facility];  Service: Orthopedics;  Laterality: Right;  right fibula and tibia pilon fractures and possible heel cord lengthening     Current Outpatient Medications:    albuterol (VENTOLIN HFA) 108 (90 Base) MCG/ACT inhaler, Inhale 2 puffs into the lungs every 6 (six) hours as needed for wheezing or shortness of breath., Disp: 8 g, Rfl: 1   alendronate (FOSAMAX) 70 MG tablet, Take 1 tablet (70 mg total) by mouth every 7 (seven) days. Take with a full glass of water on an empty stomach.,  Disp: 12 tablet, Rfl: 3   aspirin-acetaminophen-caffeine (EXCEDRIN MIGRAINE) 250-250-65 MG tablet, Take 1 tablet by mouth every 6 (six) hours as needed for headache., Disp: , Rfl:    CALCIUM PO, Take 600 mg by mouth 2 (two) times daily., Disp: , Rfl:    Cholecalciferol (D3 PO), Take 500 mcg by mouth 2 (two) times daily., Disp: , Rfl:    diphenhydrAMINE (BENADRYL) 25 MG tablet, Take 25 mg by mouth every 8 (eight) hours as needed. For allergies, Disp: , Rfl:    esomeprazole (NEXIUM) 20 MG capsule, Take 20 mg by mouth daily at 12 noon., Disp: , Rfl:    fexofenadine (ALLEGRA) 180 MG tablet, Take 180 mg by mouth daily., Disp: , Rfl:    fluticasone (FLONASE) 50 MCG/ACT nasal spray, Place 1 spray into both nostrils daily., Disp: , Rfl:    ibuprofen (ADVIL) 600 MG tablet, Take 1 tablet (600 mg total) by mouth every 6 (six) hours as needed., Disp: 30 tablet, Rfl: 0   MAGNESIUM PO, Take by mouth as needed., Disp: , Rfl:    melatonin 3 MG TABS tablet, Take 3 mg by mouth at bedtime., Disp: , Rfl:    traMADol (ULTRAM) 50 MG tablet, Take 50 mg by mouth every 6 (six) hours as needed., Disp: , Rfl:  Allergies  Allergen Reactions   Erythromycin Other (See Comments)    GI upset Other reaction(s): Other GI upset   Morphine Hives and Other (See Comments)   ROS neg/noncontributory except as noted HPI/below      Objective:     BP 90/60   Pulse 69   Temp 98.3 F (36.8 C) (Temporal)   Resp 18   Ht 5\' 4"  (1.626 m)   Wt 115 lb 4 oz (52.3 kg)   SpO2 98%   BMI 19.78 kg/m  Wt Readings from Last 3 Encounters:  09/20/22 115 lb 4 oz (52.3 kg)  08/18/22 113 lb 2 oz (51.3 kg)  05/27/22 105 lb (47.6 kg)    Physical Exam   Gen: WDWN NAD HEENT: NCAT, conjunctiva not injected, sclera nonicteric EXT:  no edema MSK: no gross abnormalities.  NEURO: A&O x3.  CN II-XII intact.  PSYCH: normal mood. Good eye contact  Discussed DXA   Assessment & Plan:  Age-related osteoporosis with current pathological  fracture, initial encounter  Other orders -     Alendronate Sodium; Take 1 tablet (70 mg total) by mouth every 7 (seven) days. Take with a full glass of water on an empty stomach.  Dispense: 12 tablet; Refill: 3   Osteoporosis w/current fracture lumbar.  Patient is wf  w/weight <130.  Taking calcium/D twice daily.  Discussed options.  Patient will not have insurance around August. Will do fosamax 70 mg weekly.  SED and how to take it.    No follow-ups on file.  Angelena Sole, MD

## 2022-10-04 ENCOUNTER — Encounter: Payer: No Typology Code available for payment source | Admitting: Family Medicine

## 2022-10-24 ENCOUNTER — Encounter: Payer: Self-pay | Admitting: Family Medicine

## 2022-10-24 ENCOUNTER — Ambulatory Visit (INDEPENDENT_AMBULATORY_CARE_PROVIDER_SITE_OTHER): Payer: No Typology Code available for payment source | Admitting: Family Medicine

## 2022-10-24 VITALS — BP 110/72 | HR 72 | Temp 98.3°F | Resp 18 | Ht 64.0 in | Wt 113.2 lb

## 2022-10-24 DIAGNOSIS — Z23 Encounter for immunization: Secondary | ICD-10-CM

## 2022-10-24 DIAGNOSIS — Z0000 Encounter for general adult medical examination without abnormal findings: Secondary | ICD-10-CM | POA: Diagnosis not present

## 2022-10-24 NOTE — Patient Instructions (Signed)

## 2022-10-24 NOTE — Progress Notes (Signed)
Phone [phone]   Subjective:   Patient is a 62 y.o. female presenting for annual physical.    Chief Complaint  Patient presents with   Annual Exam    CPE Not fasting Will call to make appointment for pap with gyn   Annual   Osteoporosis - Recent fracture L2 & L3 - She states her back pain has improved. She has been weaning off her back brace, only wearing it for half a day. Taking Fosamax 70 mg, tolerating well. States fosamax has been helping.  Possible sickness - She states she has recently experienced headaches, scratchy throat, and intermittent cough. States she does get seasonal allergies, but usually does not have headaches with allergies.   See problem oriented charting- ROS- ROS: Gen: no fever, chills  Skin: no rash, itching ENT: no ear pain, ear drainage, nasal congestion, rhinorrhea, sinus pressure, +scratchy throat, +cough Eyes: no blurry vision, double vision Resp: no cough, wheeze,SOB CV: no CP, palpitations, LE edema,  GI: no heartburn, n/v/d/c, abd pain GU: no dysuria, urgency, frequency, hematuria MSK: no joint pain, myalgias, back pain Neuro: no dizziness, weakness, vertigo. +headaches Psych: no depression, anxiety, insomnia, SI   The following were reviewed and entered/updated in epic: Past Medical History:  Diagnosis Date   Allergy    Anemia    Anxiety    hx. anxiety attacks   Degenerative disc disease, lumbar    GERD (gastroesophageal reflux disease)    occ. heartburn   Osteoporosis    Rash    on back - states may be from heating pad use   Tibia/fibula fracture    right tibial pilon and fib. fx.   Patient Active Problem List   Diagnosis Date Noted   Age-related osteoporosis with current pathological fracture 09/20/2022   B12 deficiency 10/01/2021   Muscle spasm of both lower legs 09/07/2017   Cough 05/04/2016   COPD GOLD II but f/v not physiologic 05/03/2016   Past Surgical History:  Procedure Laterality Date   DILATION AND  CURETTAGE OF UTERUS  1996   molar pregnancy,  second one after chilbirth for bleeding/retained placenta   FRACTURE SURGERY     ORIF FIBULA FRACTURE  03/15/2011   Procedure: OPEN REDUCTION INTERNAL FIXATION (ORIF) FIBULA FRACTURE;  Surgeon: [name], MD;  Location: [facility];  Service: Orthopedics;  Laterality: Right;  right fibula and tibia pilon fractures and possible heel cord lengthening    Family History  Problem Relation Age of Onset   Alzheimer's disease Mother    Colon cancer Father 39   Cervical cancer Sister    Diabetes Brother    Diabetes Maternal Grandmother    Heart attack Maternal Grandfather    Alzheimer's disease Paternal Grandmother    Stroke Paternal Grandfather    Stomach cancer Neg Hx    Esophageal cancer Neg Hx     Medications- reviewed and updated Current Outpatient Medications  Medication Sig Dispense Refill   albuterol (VENTOLIN HFA) 108 (90 Base) MCG/ACT inhaler Inhale 2 puffs into the lungs every 6 (six) hours as needed for wheezing or shortness of breath. 8 g 1   alendronate (FOSAMAX) 70 MG tablet Take 1 tablet (70 mg total) by mouth every 7 (seven) days. Take with a full glass of water on an empty stomach. 12 tablet 3   aspirin-acetaminophen-caffeine (EXCEDRIN MIGRAINE) 250-250-65 MG tablet Take 1 tablet by mouth every 6 (six) hours as needed for headache.     CALCIUM PO Take 600 mg by mouth  2 (two) times daily.     Cholecalciferol (D3 PO) Take 500 mcg by mouth 2 (two) times daily.     diphenhydrAMINE (BENADRYL) 25 MG tablet Take 25 mg by mouth every 8 (eight) hours as needed. For allergies     esomeprazole (NEXIUM) 20 MG capsule Take 20 mg by mouth daily at 12 noon.     fexofenadine (ALLEGRA) 180 MG tablet Take 180 mg by mouth daily.     fluticasone (FLONASE) 50 MCG/ACT nasal spray Place 1 spray into both nostrils daily.     ibuprofen (ADVIL) 600 MG tablet Take 1 tablet (600 mg total) by mouth every 6 (six) hours as needed. 30 tablet 0    MAGNESIUM PO Take by mouth as needed.     melatonin 3 MG TABS tablet Take 3 mg by mouth at bedtime.     traMADol (ULTRAM) 50 MG tablet Take 50 mg by mouth every 6 (six) hours as needed.     No current facility-administered medications for this visit.    Allergies-reviewed and updated Allergies  Allergen Reactions   Erythromycin Other (See Comments)    GI upset Other reaction(s): Other GI upset   Morphine Hives and Other (See Comments)    Social History   Social History Narrative   Lives with ex husband in a 2 story home.  Has one child.    Education: high school.    [employer] deli mgr-retiring    Objective  Objective:  BP 110/72   Pulse 72   Temp 98.3 F (36.8 C) (Temporal)   Resp 18   Ht 5\' 4"  (1.626 m)   Wt 113 lb 4 oz (51.4 kg)   SpO2 96%   BMI 19.44 kg/m  Physical Exam  Gen: WDWN NAD HEENT: NCAT, conjunctiva not injected, sclera nonicteric TM WNL B, OP moist, no exudates  NECK:  supple, no thyromegaly, no nodes, no carotid bruits CARDIAC: RRR, S1S2+, no murmur. DP 2+B LUNGS: CTAB. No wheezes ABDOMEN:  BS+, soft, NTND, No HSM, no masses EXT:  no edema MSK: no gross abnormalities. MS 5/5 all 4 NEURO: A&O x3.  CN II-XII intact.  PSYCH: normal mood. Good eye contact   Labs from 09/13/22: HDL 99, Trig 155, Glucose 85. TC 257, LDL 131, A1C 5.5.    Assessment and Plan   Health Maintenance counseling: 1. Anticipatory guidance: Patient counseled regarding regular dental exams q6 months, eye exams,  avoiding smoking and second hand smoke, limiting alcohol to 1 beverage per day, no illicit drugs.   2. Risk factor reduction:  Advised patient of need for regular exercise and diet rich and fruits and vegetables to reduce risk of heart attack and stroke. Exercise- Has not been exercising as often since starting work again.  Wt Readings from Last 3 Encounters:  10/24/22 113 lb 4 oz (51.4 kg)  09/20/22 115 lb 4 oz (52.3 kg)  08/18/22 113 lb 2 oz (51.3 kg)   3.  Immunizations/screenings/ancillary studies Immunization History  Administered Date(s) Administered   Tdap 10/24/2022   There are no preventive care reminders to display for this patient.   4. Cervical cancer screening- Will schedule next pap with gyn.  5. Breast cancer screening-  Last mammogram 05/24/2022. Results were normal.  6. Colon cancer screening - Last colonoscopy 02/01/2022. 3 Polyps, diverticulosis, and ext/int hemorrhoids found.  7. Skin cancer screening- I advised regular sunscreen use. Denies worrisome, changing, or new skin lesions.  8. Birth control/STD check- n/a 9. Osteoporosis screening- Bone  density done 08/22/2022. Osteoporosis.  10. Smoking associated screening - occ smoker  Wellness examination -     CBC with Differential/Platelet -     Comprehensive metabolic panel -     TSH  Need for tetanus booster -     Tdap vaccine greater than or equal to 7yo IM   Wellness-anticipatory guidance.  Work on Diet/Exercise  Check CBC,CMP,TSH.  F/u 1 yr   Tdap given.  Reviewed labs from work  Recommended follow up: Return in about 1 year (around 10/24/2023) for annual physical.  Lab/Order associations: not fasting   I,Rachel Rivera,acting as a scribe for Angelena Sole, MD.,have documented all relevant documentation on the behalf of Angelena Sole, MD,as directed by  Angelena Sole, MD while in the presence of Angelena Sole, MD.  I, Angelena Sole, MD, have reviewed all documentation for this visit. The documentation on 10/24/22 for the exam, diagnosis, procedures, and orders are all accurate and complete.   Angelena Sole, MD

## 2022-10-25 LAB — CBC WITH DIFFERENTIAL/PLATELET
Basophils Absolute: 0 10*3/uL (ref 0.0–0.1)
Basophils Relative: 0.8 % (ref 0.0–3.0)
Eosinophils Absolute: 0.1 10*3/uL (ref 0.0–0.7)
Eosinophils Relative: 2.2 % (ref 0.0–5.0)
HCT: 42.8 % (ref 36.0–46.0)
Hemoglobin: 14.2 g/dL (ref 12.0–15.0)
Lymphocytes Relative: 30.2 % (ref 12.0–46.0)
Lymphs Abs: 1.8 10*3/uL (ref 0.7–4.0)
MCHC: 33.2 g/dL (ref 30.0–36.0)
MCV: 90.7 fl (ref 78.0–100.0)
Monocytes Absolute: 0.5 10*3/uL (ref 0.1–1.0)
Monocytes Relative: 8.7 % (ref 3.0–12.0)
Neutro Abs: 3.4 10*3/uL (ref 1.4–7.7)
Neutrophils Relative %: 58.1 % (ref 43.0–77.0)
Platelets: 250 10*3/uL (ref 150.0–400.0)
RBC: 4.72 Mil/uL (ref 3.87–5.11)
RDW: 14.7 % (ref 11.5–15.5)
WBC: 5.9 10*3/uL (ref 4.0–10.5)

## 2022-10-25 LAB — COMPREHENSIVE METABOLIC PANEL
ALT: 14 U/L (ref 0–35)
AST: 23 U/L (ref 0–37)
Albumin: 4.6 g/dL (ref 3.5–5.2)
Alkaline Phosphatase: 51 U/L (ref 39–117)
BUN: 10 mg/dL (ref 6–23)
CO2: 26 mEq/L (ref 19–32)
Calcium: 10.3 mg/dL (ref 8.4–10.5)
Chloride: 99 mEq/L (ref 96–112)
Creatinine, Ser: 0.84 mg/dL (ref 0.40–1.20)
GFR: 74.47 mL/min (ref >60.00)
Glucose, Bld: 82 mg/dL (ref 70–99)
Potassium: 3.7 mEq/L (ref 3.5–5.1)
Sodium: 136 mEq/L (ref 135–145)
Total Bilirubin: 0.8 mg/dL (ref 0.2–1.2)
Total Protein: 7.1 g/dL (ref 6.0–8.3)

## 2022-10-25 LAB — TSH: TSH: 1.15 u[IU]/mL (ref 0.35–5.50)

## 2022-11-03 ENCOUNTER — Ambulatory Visit: Payer: No Typology Code available for payment source | Admitting: Family

## 2022-11-03 VITALS — BP 109/68 | HR 75 | Temp 97.5°F | Ht 64.0 in | Wt 112.4 lb

## 2022-11-03 DIAGNOSIS — M25511 Pain in right shoulder: Secondary | ICD-10-CM

## 2022-11-03 MED ORDER — PREDNISONE 10 MG PO TABS
ORAL_TABLET | ORAL | 0 refills | Status: DC
Start: 2022-11-03 — End: 2022-11-17

## 2022-11-03 NOTE — Progress Notes (Signed)
Patient ID: Jasmin Bonilla, female    DOB: April 18, 1960, 62 y.o.   MRN: [id number]  Chief Complaint  Patient presents with   Shoulder Pain    Pt c/o right shoulder pain for 3 days, No injury. Pain is sharp, Has tried ibuprofen 800 mg which does help slightly.Pt states she does sleep on her right side.    HPI:      Shoulder pain:  reports sharp, stabbing pain, denies burning, tingling or numbness. Takes 4 Ibuprofen at a time which helps. Works at [employer] and has to use her arm a lot. Denies  decreased ROM. Had pain around 2011 and xray negative. Denies any past injuries, dislocations.  Assessment & Plan:  1. Acute pain of right shoulder most likely pain d/t overuse of arm/shoulder with her job. Pt just went back to work this month after recovering from vertebrae fx as work will not let her do light duty. Sending Pred dose pack, advised on use & SE, do not take NSAIDs with this, continue applying heat up to tid prn, analgesic creams/patches tid prn. RTO if no improvement in pain or returns quickly. Can refer to Ortho or PT.  - predniSONE (DELTASONE) 10 MG tablet; Take in the morning after breakfast. 6 tab day 1, 5 tab day 2-3, 4 tab day 4, 3 tab day 5  Dispense: 23 tablet; Refill: 0  Subjective:    Outpatient Medications Prior to Visit  Medication Sig Dispense Refill   albuterol (VENTOLIN HFA) 108 (90 Base) MCG/ACT inhaler Inhale 2 puffs into the lungs every 6 (six) hours as needed for wheezing or shortness of breath. 8 g 1   alendronate (FOSAMAX) 70 MG tablet Take 1 tablet (70 mg total) by mouth every 7 (seven) days. Take with a full glass of water on an empty stomach. 12 tablet 3   aspirin-acetaminophen-caffeine (EXCEDRIN MIGRAINE) 250-250-65 MG tablet Take 1 tablet by mouth every 6 (six) hours as needed for headache.     CALCIUM PO Take 600 mg by mouth 2 (two) times daily.     Cholecalciferol (D3 PO) Take 500 mcg by mouth 2 (two) times daily.     diphenhydrAMINE (BENADRYL) 25 MG tablet  Take 25 mg by mouth every 8 (eight) hours as needed. For allergies     esomeprazole (NEXIUM) 20 MG capsule Take 20 mg by mouth daily at 12 noon.     fexofenadine (ALLEGRA) 180 MG tablet Take 180 mg by mouth daily.     fluticasone (FLONASE) 50 MCG/ACT nasal spray Place 1 spray into both nostrils daily.     ibuprofen (ADVIL) 600 MG tablet Take 1 tablet (600 mg total) by mouth every 6 (six) hours as needed. 30 tablet 0   MAGNESIUM PO Take by mouth as needed.     melatonin 3 MG TABS tablet Take 3 mg by mouth at bedtime.     traMADol (ULTRAM) 50 MG tablet Take 50 mg by mouth every 6 (six) hours as needed.     No facility-administered medications prior to visit.   Past Medical History:  Diagnosis Date   Allergy    Anemia    Anxiety    hx. anxiety attacks   Degenerative disc disease, lumbar    GERD (gastroesophageal reflux disease)    occ. heartburn   Osteoporosis    Rash    on back - states may be from heating pad use   Tibia/fibula fracture    right tibial pilon and fib. fx.  Past Surgical History:  Procedure Laterality Date   DILATION AND CURETTAGE OF UTERUS  1996   molar pregnancy,  second one after chilbirth for bleeding/retained placenta   FRACTURE SURGERY     ORIF FIBULA FRACTURE  03/15/2011   Procedure: OPEN REDUCTION INTERNAL FIXATION (ORIF) FIBULA FRACTURE;  Surgeon: [name], MD;  Location: [facility];  Service: Orthopedics;  Laterality: Right;  right fibula and tibia pilon fractures and possible heel cord lengthening   Allergies  Allergen Reactions   Erythromycin Other (See Comments)    GI upset Other reaction(s): Other GI upset   Morphine Hives and Other (See Comments)      Objective:    Physical Exam Vitals and nursing note reviewed.  Constitutional:      Appearance: Normal appearance.  Cardiovascular:     Rate and Rhythm: Normal rate and regular rhythm.  Pulmonary:     Effort: Pulmonary effort is normal.     Breath sounds: Normal breath  sounds.  Musculoskeletal:        General: Normal range of motion.     Right shoulder: No swelling, tenderness or bony tenderness. Normal range of motion. Normal strength.  Skin:    General: Skin is warm and dry.  Neurological:     Mental Status: She is alert.  Psychiatric:        Mood and Affect: Mood normal.        Behavior: Behavior normal.    BP 109/68   Pulse 75   Temp (!) 97.5 F (36.4 C) (Temporal)   Ht 5\' 4"  (1.626 m)   Wt 112 lb 6.4 oz (51 kg)   SpO2 97%   BMI 19.29 kg/m  Wt Readings from Last 3 Encounters:  11/03/22 112 lb 6.4 oz (51 kg)  10/24/22 113 lb 4 oz (51.4 kg)  09/20/22 115 lb 4 oz (52.3 kg)       Dulce Sellar, NP

## 2022-11-17 ENCOUNTER — Encounter: Payer: Self-pay | Admitting: Family Medicine

## 2022-11-17 ENCOUNTER — Ambulatory Visit: Payer: No Typology Code available for payment source | Admitting: Family Medicine

## 2022-11-17 VITALS — BP 116/80 | HR 76 | Temp 98.6°F | Resp 16 | Ht 64.0 in | Wt 115.4 lb

## 2022-11-17 DIAGNOSIS — J40 Bronchitis, not specified as acute or chronic: Secondary | ICD-10-CM | POA: Diagnosis not present

## 2022-11-17 DIAGNOSIS — R0981 Nasal congestion: Secondary | ICD-10-CM

## 2022-11-17 DIAGNOSIS — R519 Headache, unspecified: Secondary | ICD-10-CM | POA: Diagnosis not present

## 2022-11-17 DIAGNOSIS — R059 Cough, unspecified: Secondary | ICD-10-CM

## 2022-11-17 DIAGNOSIS — J029 Acute pharyngitis, unspecified: Secondary | ICD-10-CM | POA: Diagnosis not present

## 2022-11-17 LAB — POCT RAPID STREP A (OFFICE): Rapid Strep A Screen: NEGATIVE

## 2022-11-17 LAB — POC COVID19 BINAXNOW: SARS Coronavirus 2 Ag: NEGATIVE

## 2022-11-17 MED ORDER — CEFDINIR 300 MG PO CAPS: 300.0000 mg | ORAL_CAPSULE | Freq: Two times a day (BID) | ORAL | 0 refills | Status: DC

## 2022-11-17 MED ORDER — HYDROCOD POLI-CHLORPHE POLI ER 10-8 MG/5ML PO SUER: 5.0000 mL | Freq: Two times a day (BID) | ORAL | 0 refills | Status: DC | PRN

## 2022-11-17 NOTE — Patient Instructions (Signed)
Let me know if worse, not getting better

## 2022-11-17 NOTE — Progress Notes (Signed)
Subjective:     Patient ID: QUANA MCLANAHAN, female    DOB: Mar 05, 1961, 62 y.o.   MRN: [id number]  Chief Complaint  Patient presents with   Cough    Productive cough with green mucus Sx started 2 days ago, had a virtual visit, completed antibiotics   Sore Throat   Headache   Nasal Congestion    HPI  Cough - She complains of productive cough with green mucus, sore throat, nasal congestion, and headache. Her cough has become more productive with the mucus becoming more green. Her sx started 2.5-3 weeks ago, but worsened on Monday 8/5. She had a telehealth visit and was proscribed Z-pak, which she started on 8/2. She states she had to call out of work due to her sx worsening. She endorses some sob this morning, used her inhaler before coming to her visit. She has also been taking alka seltzer plus, stating it upsets her stomach when taking for too long. Tessalon Perles do not work. Has a hx of pneumonia. Denies any no fever. Used last of tussionex from January  Year-round allergies - She notes she has year-round allergies, not seasonal allergies. She takes allegra 180 mg.   Health Maintenance Due  Topic Date Due   PAP SMEAR-Modifier  Never done    Past Medical History:  Diagnosis Date   Allergy    Anemia    Anxiety    hx. anxiety attacks   Degenerative disc disease, lumbar    GERD (gastroesophageal reflux disease)    occ. heartburn   Osteoporosis    Rash    on back - states may be from heating pad use   Tibia/fibula fracture    right tibial pilon and fib. fx.    Past Surgical History:  Procedure Laterality Date   DILATION AND CURETTAGE OF UTERUS  1996   molar pregnancy,  second one after chilbirth for bleeding/retained placenta   FRACTURE SURGERY     ORIF FIBULA FRACTURE  03/15/2011   Procedure: OPEN REDUCTION INTERNAL FIXATION (ORIF) FIBULA FRACTURE;  Surgeon: [name], MD;  Location: [facility];  Service: Orthopedics;  Laterality: Right;  right fibula  and tibia pilon fractures and possible heel cord lengthening     Current Outpatient Medications:    albuterol (VENTOLIN HFA) 108 (90 Base) MCG/ACT inhaler, Inhale 2 puffs into the lungs every 6 (six) hours as needed for wheezing or shortness of breath., Disp: 8 g, Rfl: 1   alendronate (FOSAMAX) 70 MG tablet, Take 1 tablet (70 mg total) by mouth every 7 (seven) days. Take with a full glass of water on an empty stomach., Disp: 12 tablet, Rfl: 3   aspirin-acetaminophen-caffeine (EXCEDRIN MIGRAINE) 250-250-65 MG tablet, Take 1 tablet by mouth every 6 (six) hours as needed for headache., Disp: , Rfl:    CALCIUM PO, Take 600 mg by mouth 2 (two) times daily., Disp: , Rfl:    cefdinir (OMNICEF) 300 MG capsule, Take 1 capsule (300 mg total) by mouth 2 (two) times daily., Disp: 14 capsule, Rfl: 0   chlorpheniramine-HYDROcodone (TUSSIONEX) 10-8 MG/5ML, Take 5 mLs by mouth every 12 (twelve) hours as needed for cough., Disp: 70 mL, Rfl: 0   Cholecalciferol (D3 PO), Take 500 mcg by mouth 2 (two) times daily., Disp: , Rfl:    diphenhydrAMINE (BENADRYL) 25 MG tablet, Take 25 mg by mouth every 8 (eight) hours as needed. For allergies, Disp: , Rfl:    esomeprazole (NEXIUM) 20 MG capsule, Take 20 mg  by mouth daily at 12 noon., Disp: , Rfl:    fexofenadine (ALLEGRA) 180 MG tablet, Take 180 mg by mouth daily., Disp: , Rfl:    fluticasone (FLONASE) 50 MCG/ACT nasal spray, Place 1 spray into both nostrils daily., Disp: , Rfl:    ibuprofen (ADVIL) 600 MG tablet, Take 1 tablet (600 mg total) by mouth every 6 (six) hours as needed., Disp: 30 tablet, Rfl: 0   MAGNESIUM PO, Take by mouth as needed., Disp: , Rfl:    melatonin 3 MG TABS tablet, Take 3 mg by mouth at bedtime., Disp: , Rfl:    traMADol (ULTRAM) 50 MG tablet, Take 50 mg by mouth every 6 (six) hours as needed., Disp: , Rfl:   Allergies  Allergen Reactions   Erythromycin Other (See Comments)    GI upset Other reaction(s): Other GI upset   Morphine Hives and  Other (See Comments)   ROS neg/noncontributory except as noted HPI/below      Objective:     BP 116/80   Pulse 76   Temp 98.6 F (37 C) (Temporal)   Resp 16   Ht 5\' 4"  (1.626 m)   Wt 115 lb 6 oz (52.3 kg)   SpO2 97%   BMI 19.80 kg/m  Wt Readings from Last 3 Encounters:  11/17/22 115 lb 6 oz (52.3 kg)  11/03/22 112 lb 6.4 oz (51 kg)  10/24/22 113 lb 4 oz (51.4 kg)    Physical Exam   Gen: WDWN NAD HEENT: NCAT, conjunctiva not injected, sclera nonicteric TM WNL B, OP moist, no exudates  NECK:  supple, no thyromegaly, no nodes, no carotid bruits. +Mildly swollen lymph nodes. CARDIAC: RRR, S1S2+, no murmur. LUNGS: CTAB. No wheezes EXT:  no edema MSK: no gross abnormalities.  NEURO: A&O x3.  CN II-XII intact.  PSYCH: normal mood. Good eye contact  Results for orders placed or performed in visit on 11/17/22  POC COVID-19  Result Value Ref Range   SARS Coronavirus 2 Ag Negative Negative  POCT rapid strep A  Result Value Ref Range   Rapid Strep A Screen Negative Negative      Assessment & Plan:  Bronchitis  Cough, unspecified type -     POC COVID-19 BinaxNow -     POCT rapid strep A  Sore throat -     POC COVID-19 BinaxNow -     POCT rapid strep A  Nasal congestion -     POC COVID-19 BinaxNow -     POCT rapid strep A  Nonintractable headache, unspecified chronicity pattern, unspecified headache type -     POC COVID-19 BinaxNow -     POCT rapid strep A  Other orders -     Cefdinir; Take 1 capsule (300 mg total) by mouth 2 (two) times daily.  Dispense: 14 capsule; Refill: 0 -     Hydrocod Poli-Chlorphe Poli ER; Take 5 mLs by mouth every 12 (twelve) hours as needed for cough.  Dispense: 70 mL; Refill: 0    Return if symptoms worsen or fail to improve.  Bronchitis-zpk not work-getting worse(?viral now bacterial).  Will do omnicef 300mg  bid and tussionex(PDMP checked)  I,Rachel Rivera,acting as a scribe for Angelena Sole, MD.,have documented all relevant  documentation on the behalf of Angelena Sole, MD,as directed by  Angelena Sole, MD while in the presence of Angelena Sole, MD.  I, Angelena Sole, MD, have reviewed all documentation for this visit. The documentation on 11/17/22 for  the exam, diagnosis, procedures, and orders are all accurate and complete.   Angelena Sole, MD

## 2023-08-24 ENCOUNTER — Other Ambulatory Visit: Payer: Self-pay | Admitting: Family Medicine

## 2024-03-15 ENCOUNTER — Ambulatory Visit: Payer: Self-pay

## 2024-03-15 NOTE — Telephone Encounter (Signed)
 Noted has appt with another provider

## 2024-03-15 NOTE — Telephone Encounter (Signed)
 FYI Only or Action Required?: FYI only for provider: appointment scheduled on 03/18/24.  Patient was last seen in primary care on 11/17/2022 by Jasmin Jenkins Jansky, MD.  Called Nurse Triage reporting Abdominal Pain.  Symptoms began several weeks ago.  Interventions attempted: OTC medications: tylenol ; ibuprofen  and Ice/heat application.  Symptoms are: gradually worsening.  Triage Disposition: See PCP When Office is Open (Within 3 Days)  Patient/caregiver understands and will follow disposition?: Yes  Copied from CRM [id number]. Topic: Clinical - Red Word Triage >> Mar 15, 2024  9:45 AM Jasmin Bonilla wrote: Red Word that prompted transfer to Nurse Triage: neck pain (worsening), pain in abdomen and left side, possible kidney stones, nausea starting last night, throwing up this morning possibly from pain. Reason for Disposition  Neck pain present > 2 weeks  Answer Assessment - Initial Assessment Questions Pt called in stating she has worsening neck pain for the past 2 weeks. Reports she felt like she had slept wrong and her neck was sore then tripped over a space heater, hitting her head and neck pain has worsened; all occurred 2 weeks ago. Pt using ibuprofen , tylenol  and heat application. Pt reports since then she has developed abdominal pain that feels like cramps that also goes to her L side; pt reports being post menopausal. She thought she may have kidney stones as she had n/v this morning x1. Pt denies discoloration to emesis, blood in urine or any urinary discomfort. Pain is relieved with ibuprofen  and tylenol . Pt elects to come in to clinic and willing to see alternative provider. Discussed red flag symptoms of chest pain, SOB, numbness or tingling and severe h/a that should prompt her  to go to ED. Appointment scheduled for evaluation. Patient agrees with plan of care, and will call back if anything changes, or if symptoms worsen.     1. ONSET: When did the pain begin?      2 weeks ago;  worsened with a fall where she tripped and hit her head  2. LOCATION: Where does it hurt?      L side moved to back of neck   3. PATTERN Does the pain come and go, or has it been constant since it started?      Comes and goes   4. SEVERITY: How bad is the pain?  (Scale 0-10; or none or slight stiffness, mild, moderate, severe)     9-10   5. RADIATION: Does the pain go anywhere else, shoot into your arms?     None   6. CORD SYMPTOMS: Any weakness or numbness of the arms or legs?     None   7. CAUSE: What do you think is causing the neck pain?     Pt thought she slept wrong, had a creak in her neck then she tripped over her heater and fell causing pain to worsen  8. NECK OVERUSE: Any recent activities that involved turning or twisting the neck?     Pt reports pain worse at work when having to look down   9. OTHER SYMPTOMS: Do you have any other symptoms? (e.g., headache, fever, chest pain, difficulty breathing, neck swelling)     Headache 1-2x in the past 2 weeks; nothing severe per pt  Answer Assessment - Initial Assessment Questions 1. LOCATION: Where does it hurt?      Below belly button and L side/ flank   2. RADIATION: Does the pain shoot anywhere else? (e.g., chest, back)     No  3. ONSET: When did the pain begin? (e.g., minutes, hours or days ago)      2 weeks ago   4. SUDDEN: Gradual or sudden onset?     Sudden  5. PATTERN Does the pain come and go, or is it constant?     Comes and goes   6. SEVERITY: How bad is the pain?  (e.g., Scale 1-10; mild, moderate, or severe)     7-8   7. RECURRENT SYMPTOM: Have you ever had this type of stomach pain before? If Yes, ask: When was the last time? and What happened that time?      None; pt states it feels crampy but she no longer has a menstrual cycle   8. CAUSE: What do you think is causing the stomach pain? (e.g., gallstones, recent abdominal surgery)     Unknown   9.  RELIEVING/AGGRAVATING FACTORS: What makes it better or worse? (e.g., antacids, bending or twisting motion, bowel movement)     Ibuprofen / tylenol    10. OTHER SYMPTOMS: Do you have any other symptoms? (e.g., back pain, diarrhea, fever, urination pain, vomiting)       Back pain, n/v and neck pain  Protocols used: Abdominal Pain - Female-A-AH, Neck Pain or Stiffness-A-AH

## 2024-03-18 ENCOUNTER — Ambulatory Visit

## 2024-03-18 ENCOUNTER — Ambulatory Visit: Payer: Self-pay | Admitting: Family Medicine

## 2024-03-18 ENCOUNTER — Encounter: Payer: Self-pay | Admitting: Family Medicine

## 2024-03-18 VITALS — BP 100/60 | HR 77 | Temp 97.3°F | Ht 64.0 in | Wt 107.8 lb

## 2024-03-18 DIAGNOSIS — R109 Unspecified abdominal pain: Secondary | ICD-10-CM

## 2024-03-18 DIAGNOSIS — M542 Cervicalgia: Secondary | ICD-10-CM | POA: Diagnosis not present

## 2024-03-18 LAB — POCT URINALYSIS DIPSTICK
Bilirubin, UA: NEGATIVE
Blood, UA: NEGATIVE
Glucose, UA: NEGATIVE
Ketones, UA: POSITIVE
Nitrite, UA: NEGATIVE
Protein, UA: POSITIVE — AB
Spec Grav, UA: >=1.03 — AB (ref 1.010–1.025)
Urobilinogen, UA: 0.2 U/dL
pH, UA: 5.5 (ref 5.0–8.0)

## 2024-03-18 MED ORDER — IBUPROFEN 600 MG PO TABS: 600.0000 mg | ORAL_TABLET | Freq: Four times a day (QID) | ORAL | 0 refills | Status: AC | PRN

## 2024-03-18 MED ORDER — CYCLOBENZAPRINE HCL 10 MG PO TABS: 10.0000 mg | ORAL_TABLET | Freq: Three times a day (TID) | ORAL | 0 refills | Status: DC | PRN

## 2024-03-18 MED ORDER — NITROFURANTOIN MONOHYD MACRO 100 MG PO CAPS: 100.0000 mg | ORAL_CAPSULE | Freq: Two times a day (BID) | ORAL | 0 refills | Status: AC

## 2024-03-18 NOTE — Patient Instructions (Signed)
 It was very nice to see you today!  VISIT SUMMARY: Today, you were seen for abdominal and neck pain. We discussed your symptoms, and I have provided a treatment plan to help manage your pain and investigate the underlying causes.  YOUR PLAN: NECK AND UPPER BACK PAIN WITH MUSCLE SPASM: You have been experiencing chronic neck and upper back pain with muscle spasms, possibly due to muscle strain or arthritis. -Continue taking Flexeril  for muscle spasms and ibuprofen  for inflammation as prescribed. -Use a heating pad to help relieve pain. -Follow the stretches and exercises provided in the handout to help with neck and back pain. -An x-ray of your upper back has been ordered to check for any underlying issues.  ABDOMINAL PAIN, INTERMITTENT, ETIOLOGY UNCLEAR: You have been experiencing intermittent abdominal pain. The cause is not clear, but it could be related to kidney stones or a urinary tract infection (UTI). -A urine specimen has been obtained to check for kidney stones or a UTI. -Stay hydrated by increasing your water intake.  Return if symptoms worsen or fail to improve.   Take care, Dr Kennyth  PLEASE NOTE:  If you had any lab tests, please let us  know if you have not heard back within a few days. You may see your results on mychart before we have a chance to review them but we will give you a call once they are reviewed by us .   If we ordered any referrals today, please let us  know if you have not heard from their office within the next week.   If you had any urgent prescriptions sent in today, please check with the pharmacy within an hour of our visit to make sure the prescription was transmitted appropriately.   Please try these tips to maintain a healthy lifestyle:  Eat at least 3 REAL meals and 1-2 snacks per day.  Aim for no more than 5 hours between eating.  If you eat breakfast, please do so within one hour of getting up.   Each meal should contain half fruits/vegetables,  one quarter protein, and one quarter carbs (no bigger than a computer mouse)  Cut down on sweet beverages. This includes juice, soda, and sweet tea.   Drink at least 1 glass of water with each meal and aim for at least 8 glasses per day  Exercise at least 150 minutes every week.

## 2024-03-18 NOTE — Progress Notes (Signed)
   Jasmin Bonilla is a 63 y.o. female who presents today for an office visit.  Assessment/Plan:  Neck Pain  No red flags.  Most consistent with muscular strain.  Given length of symptoms will check x-ray.  Will treat with Flexeril  and ibuprofen .  Also discussed home exercise program and handout was given.  We discussed reasons to return to care.  Follow-up as needed.  Abdominal Pain No red flags.  Patient with intermittent left lower quadrant abdominal pain.  Overall reassuring exam.  UA today consistent with possible urinary tract infection.  Will empirically start Macrobid  will await culture results.  Encouraged hydration.  Will check urine culture to definitively rule out UTI.  Did have diverticulosis on most recent colonoscopy however history is not consistent with diverticulitis.  If urine culture is negative will need to explore other potential etiologies.  Discussed reasons to return to care.     Subjective:  HPI:  See assessment / plan for status of chronic conditions.   Discussed the use of AI scribe software for clinical note transcription with the patient, who gave verbal consent to proceed.  History of Present Illness Jasmin Bonilla is a 63 year old female who presents with abdominal and neck pain.  She has been experiencing abdominal pain primarily on the left side, occasionally on the right, for about a month. The pain sometimes resembles menstrual cramps, despite not having a menstrual cycle for a year. She recalls a specific instance where touching her abdomen caused significant pain, but this has not recurred. She has a history of kidney stones, which previously caused severe pain and vomiting, but she has not experienced nausea or vomiting recently. She notes a decrease in water intake due to being busy at work.  She has been experiencing neck pain for over a month, initially thought to be a 'crick' that would resolve. The pain has worsened, with popping sounds and increased  pain after a fall on November 24th, where she tripped over a heater cord and hit her head. She does not recall the fall but was informed by a companion that the heater was on top of her. The neck pain has since spread to her back, causing a 'catch' that forced her to leave work. She took a Flexeril  from 2020, which provided some relief and helped her sleep. She has been using a heating pad and taking Tylenol  Extra Strength and Motrin  for pain management. Her neck pain worsens when looking down at work.  No recent nausea, vomiting, constipation, diarrhea, or blood in the urine. She has not experienced burning or urinary issues.         Objective:  Physical Exam: BP 100/60   Pulse 77   Temp (!) 97.3 F (36.3 C) (Temporal)   Ht 5' 4 (1.626 m)   Wt 107 lb 12.8 oz (48.9 kg)   BMI 18.50 kg/m   Gen: No acute distress, resting comfortably CV: Regular rate and rhythm with no murmurs appreciated Pulm: Normal work of breathing, clear to auscultation bilaterally with no crackles, wheezes, or rhonchi Abdomen: No deformities, soft, nontender, nondistended.  Bowel sounds present MUSCULOSKELETAL:  - Neck: No deformities.  Mild tenderness palpation along right lower paracervical muscles.  Neurovascular intact distally Neuro: Grossly normal, moves all extremities Psych: Normal affect and thought content      Ary Rudnick M. Kennyth, MD 03/18/2024 2:49 PM

## 2024-03-19 LAB — URINE CULTURE
MICRO NUMBER:: 17326197
SPECIMEN QUALITY:: ADEQUATE

## 2024-03-20 ENCOUNTER — Other Ambulatory Visit: Payer: Self-pay

## 2024-03-20 ENCOUNTER — Ambulatory Visit: Payer: Self-pay | Admitting: Family Medicine

## 2024-03-20 DIAGNOSIS — R109 Unspecified abdominal pain: Secondary | ICD-10-CM

## 2024-03-20 NOTE — Progress Notes (Signed)
 Future labs ordered. Lab only visit made, Follow-up appointment made with PCP.

## 2024-03-20 NOTE — Progress Notes (Signed)
 Her urine culture was inconclusive.  Recommend she finish her antibiotics and come back next week to recheck UA and urine culture to ensure the potential UTI was eradicated however she should let us  know if her symptoms are not improving or if she is having any new symptoms since our visit.

## 2024-03-22 ENCOUNTER — Ambulatory Visit: Admitting: Family Medicine

## 2024-03-25 ENCOUNTER — Telehealth: Payer: Self-pay

## 2024-03-25 ENCOUNTER — Other Ambulatory Visit

## 2024-03-25 DIAGNOSIS — R109 Unspecified abdominal pain: Secondary | ICD-10-CM

## 2024-03-25 LAB — URINALYSIS, ROUTINE W REFLEX MICROSCOPIC
Bilirubin Urine: NEGATIVE
Hgb urine dipstick: NEGATIVE
Ketones, ur: NEGATIVE
Leukocytes,Ua: NEGATIVE
Nitrite: NEGATIVE
RBC / HPF: NONE SEEN (ref 0–?)
Specific Gravity, Urine: 1.01 (ref 1.000–1.030)
Total Protein, Urine: NEGATIVE
Urine Glucose: NEGATIVE
Urobilinogen, UA: 0.2 (ref 0.0–1.0)
WBC, UA: NONE SEEN (ref 0–?)
pH: 7.5 (ref 5.0–8.0)

## 2024-03-25 NOTE — Progress Notes (Signed)
 X-ray shows degenerative changes in her neck.  This is very common for her age.  This should improve with the treatment plan we discussed at her office visit.  She should let me or her PCP know if symptoms are not improving.

## 2024-03-25 NOTE — Telephone Encounter (Signed)
 Called pt and discussed  Jasmin Bonilla CCMA

## 2024-03-25 NOTE — Telephone Encounter (Signed)
°  Please advise   Copied from CRM #[id number]. Topic: Clinical - Lab/Test Results >> Mar 22, 2024  1:18 PM Lauren C wrote: Reason for CRM: FYI- pt calling for xray results from Monday. She says she was told she would get results within 3 days and has not heard back yet. Please return call at [phone]

## 2024-03-26 LAB — URINE CULTURE
MICRO NUMBER:: 17356106
Result:: NO GROWTH
SPECIMEN QUALITY:: ADEQUATE

## 2024-03-28 ENCOUNTER — Ambulatory Visit: Payer: Self-pay | Admitting: Family Medicine

## 2024-03-28 NOTE — Progress Notes (Signed)
 Her urine culture and urinalysis are both normal.  No signs of UTI.  She should let us  know if her symptoms have not improved.

## 2024-04-15 ENCOUNTER — Ambulatory Visit: Admitting: Family Medicine

## 2024-05-03 ENCOUNTER — Other Ambulatory Visit: Payer: Self-pay | Admitting: Family Medicine
